# Patient Record
Sex: Female | Born: 1980 | Race: Black or African American | Hispanic: No | Marital: Single | Smoking: Former smoker
Health system: Southern US, Community
[De-identification: ages and names within clinical notes are randomized; demographics above are authoritative.]

## PROBLEM LIST (undated history)

## (undated) DIAGNOSIS — D649 Anemia, unspecified: Secondary | ICD-10-CM

## (undated) DIAGNOSIS — D509 Iron deficiency anemia, unspecified: Secondary | ICD-10-CM

## (undated) DIAGNOSIS — D259 Leiomyoma of uterus, unspecified: Secondary | ICD-10-CM

## (undated) DIAGNOSIS — N83201 Unspecified ovarian cyst, right side: Secondary | ICD-10-CM

## (undated) HISTORY — PX: TONSILLECTOMY: SUR1361

## (undated) HISTORY — DX: Anemia, unspecified: D64.9

## (undated) HISTORY — PX: OTHER SURGICAL HISTORY: SHX169

---
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
## 2004-09-04 ENCOUNTER — Ambulatory Visit: Payer: Self-pay | Admitting: Internal Medicine

## 2006-05-03 ENCOUNTER — Ambulatory Visit: Payer: Self-pay

## 2007-06-07 IMAGING — CT CT HEAD WITHOUT AND WITH CONTRAST
1 of 2 series · 13 of 30 positions shown, 17 images · non-contrast
Comparison: none

REASON FOR EXAM: syncope
COMMENTS:

[Series 2: without · axial · non-contrast · 0.39mm/px · z∈[-133,-13]mm · 13 of 29 slices shown, 17 images]
[im 3/29  brain]
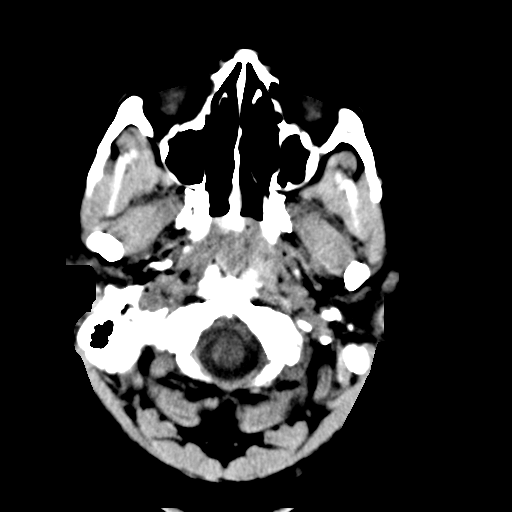
[im 3/29  bone]
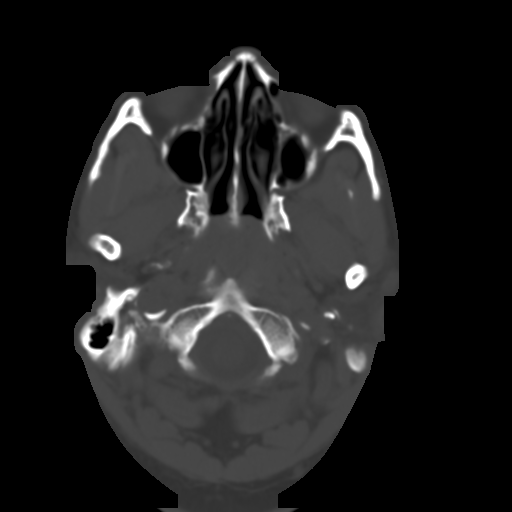
[im 5/29  brain]
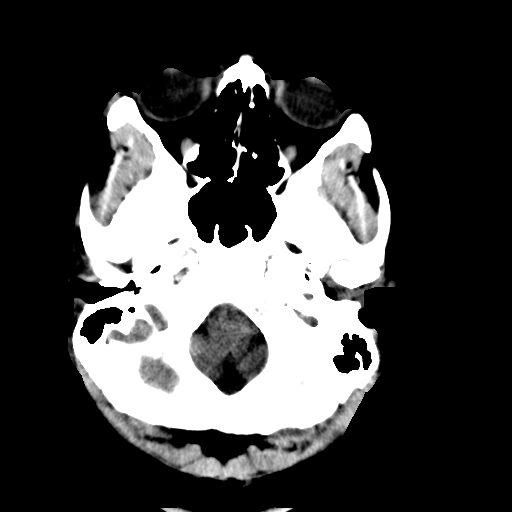
[im 7/29  brain]
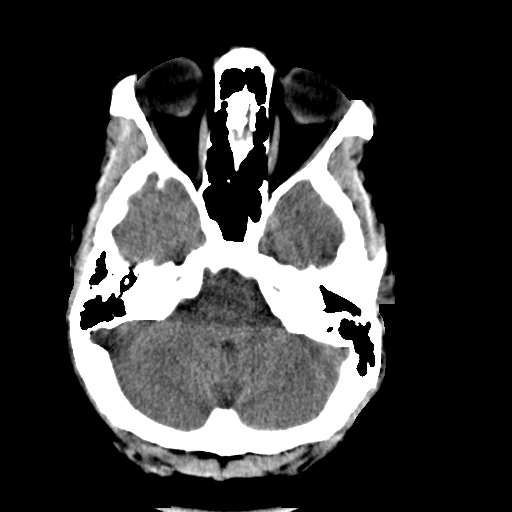
[im 9/29  brain]
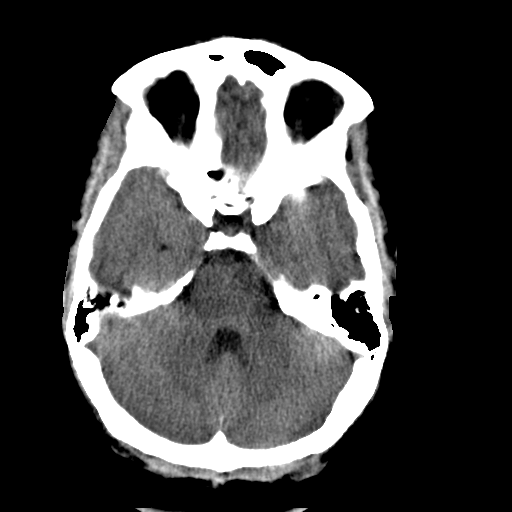
[im 11/29  brain]
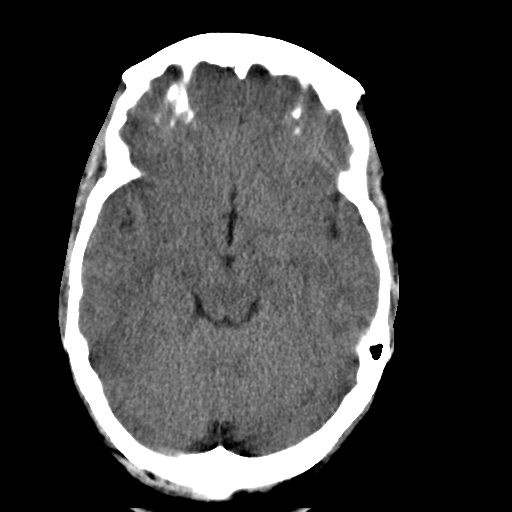
[im 11/29  bone]
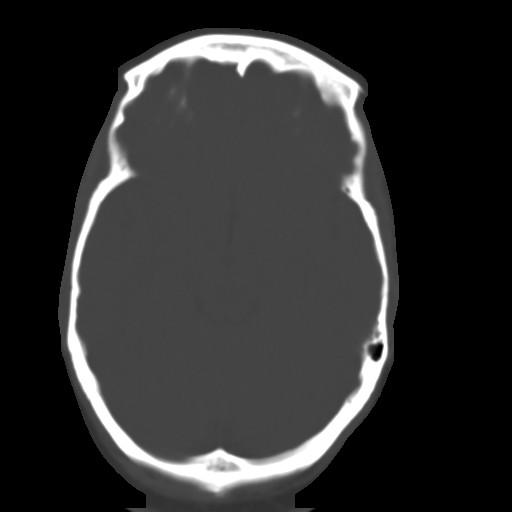
[im 13/29  brain]
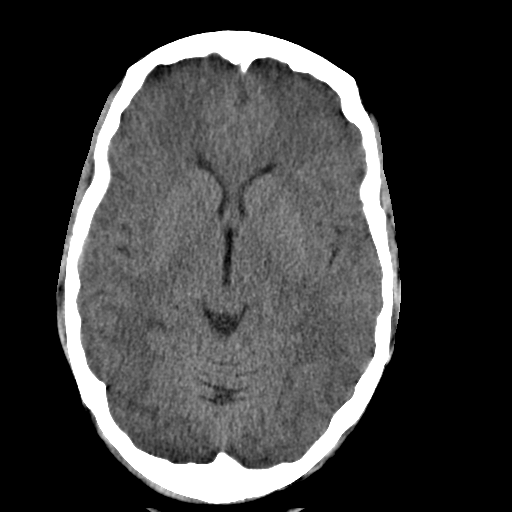
[im 15/29  brain]
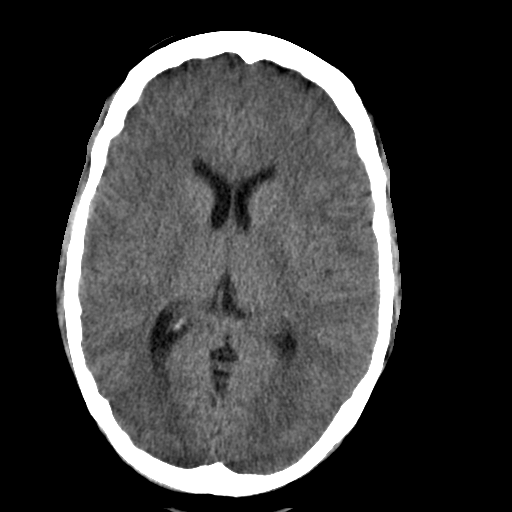
[im 17/29  brain]
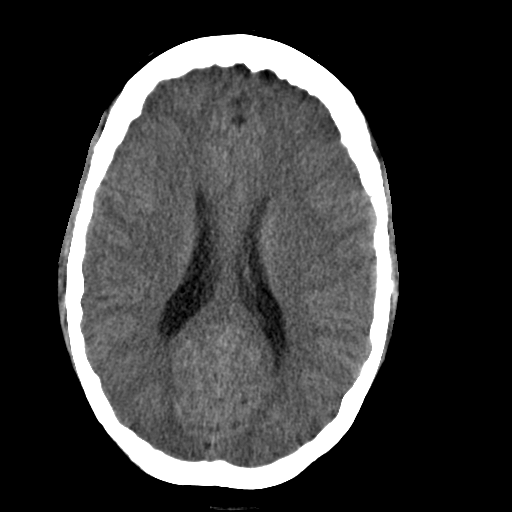
[im 19/29  brain]
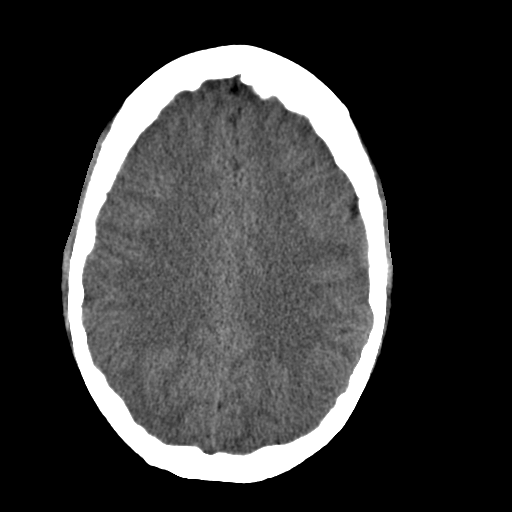
[im 19/29  bone]
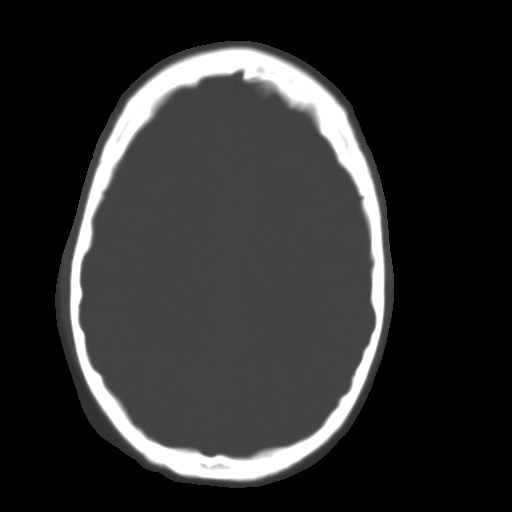
[im 21/29  brain]
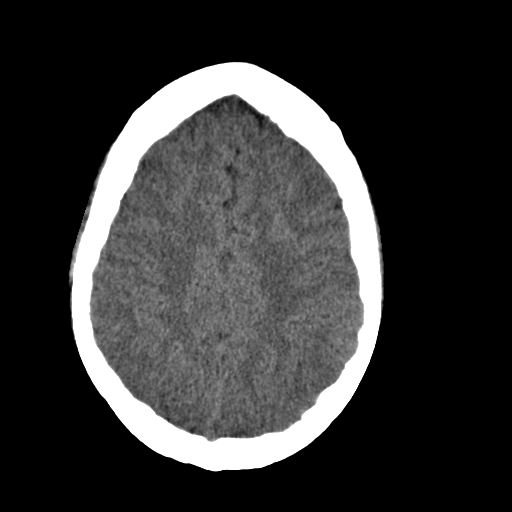
[im 23/29  brain]
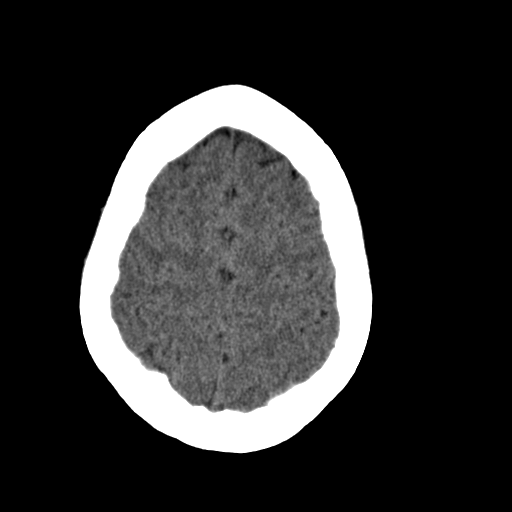
[im 25/29  brain]
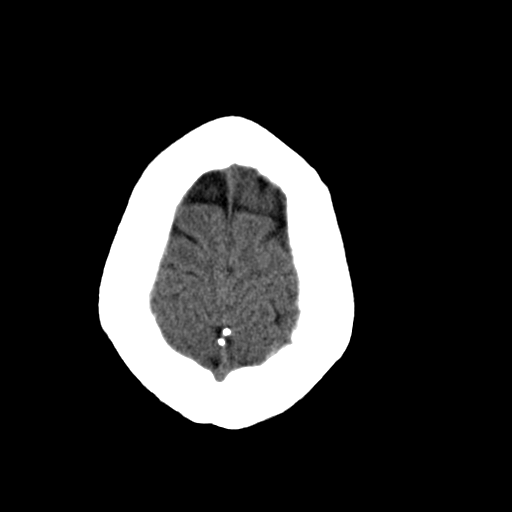
[im 27/29  brain]
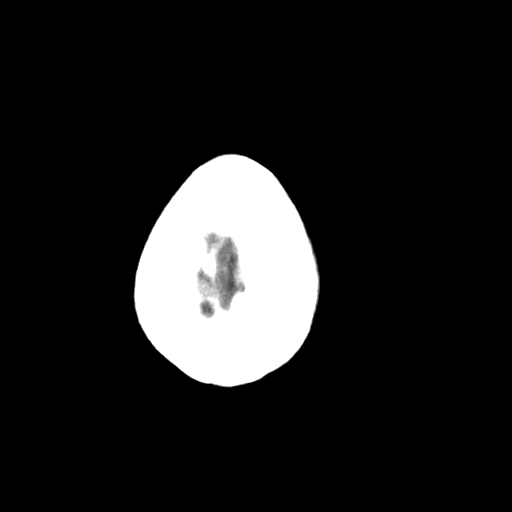
[im 27/29  bone]
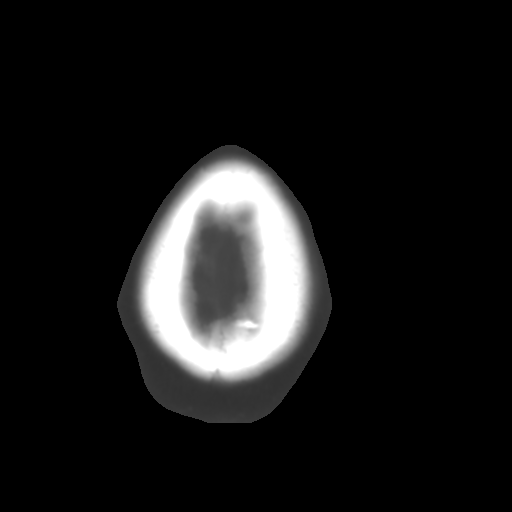

[13 of 30 positions shown; findings below may reference images not displayed]

PROCEDURE:     CT  - CT HEAD W/WO  - May 03, 2006  [DATE]

RESULT:     Pre and post contrast CT of the brain is performed. The
paranasal sinuses appear to be normally aerated. There is no air fluid
level. The mastoid sinuses appear to be unremarkable.  There is no evidence
of hemorrhage. There is no mass effect or midline shift. There is no
extra-axial hemorrhage. There is no abnormal enhancement.
IMPRESSION: 1)No focal intracranial abnormality evident.  Should the patient's symptoms
persist consideration for MRI would be recommended.

## 2009-12-01 ENCOUNTER — Ambulatory Visit: Payer: Self-pay | Admitting: General Practice

## 2012-03-07 ENCOUNTER — Emergency Department: Payer: Self-pay | Admitting: Unknown Physician Specialty

## 2014-04-22 ENCOUNTER — Emergency Department: Payer: Self-pay | Admitting: Emergency Medicine

## 2022-02-08 ENCOUNTER — Other Ambulatory Visit: Payer: Self-pay | Admitting: Internal Medicine

## 2022-02-08 DIAGNOSIS — N6321 Unspecified lump in the left breast, upper outer quadrant: Secondary | ICD-10-CM

## 2022-02-25 DIAGNOSIS — D509 Iron deficiency anemia, unspecified: Secondary | ICD-10-CM | POA: Insufficient documentation

## 2022-02-25 NOTE — Progress Notes (Signed)
?[place]  ?[phone] [phone] ? ?ID: Krista Holloway OB: 1981-04-23  MR#: [id number]  JJK#:[id number] ? ?Patient Care Team: ?Perrin Maltese, MD as PCP - General (Internal Medicine) ? ?CHIEF COMPLAINT: Iron deficiency anemia.  ? ?INTERVAL HISTORY: Patient is a 41 year old female who has had chronic iron deficiency anemia for years secondary to heavy menses.  She admits to difficulty tolerating oral iron supplementation.  More recently, she has noted to have hemoglobin of 4.9.  She has chronic weakness and fatigue, but otherwise feels well.  She has no neurologic complaints.  She denies any recent fevers or illnesses.  She has a good appetite and denies weight loss.  She has no chest pain, shortness of breath, cough, or hemoptysis.  She denies any nausea, vomiting, constipation, or diarrhea.  She has no melena or hematochezia.  She has no urinary complaints.  Patient otherwise feels well and offers no further specific complaints today. ? ?REVIEW OF SYSTEMS:   ?Review of Systems  ?Constitutional:  Positive for malaise/fatigue. Negative for fever and weight loss.  ?Respiratory: Negative.  Negative for cough, hemoptysis and shortness of breath.   ?Cardiovascular: Negative.  Negative for chest pain and leg swelling.  ?Gastrointestinal: Negative.  Negative for abdominal pain, blood in stool and melena.  ?Genitourinary: Negative.  Negative for hematuria.  ?Musculoskeletal: Negative.  Negative for back pain.  ?Skin: Negative.  Negative for rash.  ?Neurological:  Positive for weakness. Negative for dizziness and focal weakness.  ?Psychiatric/Behavioral: Negative.  The patient is not nervous/anxious.   ? ?As per HPI. Otherwise, a complete review of systems is negative. ? ?PAST MEDICAL HISTORY: ?No past medical history on file. ? ?PAST SURGICAL HISTORY: ?No past surgical history on file. ? ?FAMILY HISTORY: ?Family History  ?Problem Relation Age of Onset  ? Breast cancer Neg Hx    ? ? ?ADVANCED DIRECTIVES (Y/N):  N ? ?HEALTH MAINTENANCE: ?  ? ? Colonoscopy: ? PAP: ? Bone density: ? Lipid panel: ? ?Allergies  ?Allergen Reactions  ? Amoxicillin   ?  Other reaction(s): Unknown ?Other reaction(s): Unknown ?  ? ? ?Current Outpatient Medications  ?Medication Sig Dispense Refill  ? HYDROcodone-acetaminophen (NORCO/VICODIN) 5-325 MG tablet Take by mouth.    ? Multiple Vitamin (MULTI-VITAMIN) tablet Take 1 tablet by mouth daily.    ? ?No current facility-administered medications for this visit.  ? ? ?OBJECTIVE: ?There were no vitals filed for this visit.   There is no height or weight on file to calculate BMI.    ECOG FS:1 - Symptomatic but completely ambulatory ? ?General: Well-developed, well-nourished, no acute distress. ?Eyes: Pink conjunctiva, anicteric sclera. ?HEENT: Normocephalic, moist mucous membranes. ?Lungs: No audible wheezing or coughing. ?Heart: Regular rate and rhythm. ?Abdomen: Soft, nontender, no obvious distention. ?Musculoskeletal: No edema, cyanosis, or clubbing. ?Neuro: Alert, answering all questions appropriately. Cranial nerves grossly intact. ?Skin: No rashes or petechiae noted. ?Psych: Normal affect. ?Lymphatics: No cervical, calvicular, axillary or inguinal LAD. ? ? ?LAB RESULTS: ? ?No results found for: NA, K, CL, CO2, GLUCOSE, BUN, CREATININE, CALCIUM, PROT, ALBUMIN, AST, ALT, ALKPHOS, BILITOT, GFRNONAA, GFRAA ? ?Lab Results  ?Component Value Date  ? WBC 3.3 (L) 02/26/2022  ? NEUTROABS 1.4 (L) 02/26/2022  ? HGB 4.9 (LL) 02/26/2022  ? HCT 20.6 (L) 02/26/2022  ? MCV 56.6 (L) 02/26/2022  ? PLT 493 (H) 02/26/2022  ? ?No results found for: IRON, TIBC, IRONPCTSAT ?No results found for: FERRITIN ? ? ?STUDIES: ?US BREAST LTD UNI LEFT INC  AXILLA ? ?Result Date: 02/26/2022 ?CLINICAL DATA:  41 year old female presenting for evaluation of a palpable lump in the left breast for 2-3 weeks. Of note, the patient was recently treated for a significant urinary tract infection, and feels  that since she took the antibiotics, that the lump and pain reduced in size and tenderness as well. EXAM: DIGITAL DIAGNOSTIC BILATERAL MAMMOGRAM WITH TOMOSYNTHESIS AND CAD; ULTRASOUND LEFT BREAST LIMITED TECHNIQUE: Bilateral digital diagnostic mammography and breast tomosynthesis was performed. The images were evaluated with computer-aided detection.; Targeted ultrasound examination of the left breast was performed. COMPARISON:  Previous exam(s). ACR Breast Density Category b: There are scattered areas of fibroglandular density. FINDINGS: There is a 6 cm focal asymmetry in the upper outer quadrant of the left breast, anterior depth which is deep to the BB which has been placed at the palpable site of concern. There may be an obscured mass within this asymmetry. No other suspicious calcifications, masses or areas of distortion are seen in the bilateral breasts. Ultrasound targeted to the palpable site in the left breast at 2:30, 3 cm from the nipple demonstrates a in anechoic oval circumscribed mass measuring 1.3 cm. There is a surrounding area of hypoechoic tissue, which altogether spans approximately 2.3 cm. IMPRESSION: 1. There is a 6 cm focal asymmetry on the mammogram surrounding a benign-appearing cyst. This may represent a cyst with resolving infection given its response to her course of antibiotics. 2. No suspicious calcifications, masses or areas of distortion are seen in the bilateral breasts. RECOMMENDATION: 1. Three month follow-up left breast mammogram and ultrasound is recommended. The patient was advised to come back sooner if she feels the lump is increasing in size or becoming more painful. She is also aware that if the abnormality is concerning in appearance on her follow-up than biopsy would be recommended. I have discussed the findings and recommendations with the patient. If applicable, a reminder letter will be sent to the patient regarding the next appointment. BI-RADS CATEGORY  3: Probably  benign. Electronically Signed   By: Ammie Ferrier M.D.   On: 02/26/2022 11:39 ? ?MM DIAG BREAST TOMO BILATERAL ? ?Result Date: 02/26/2022 ?CLINICAL DATA:  41 year old female presenting for evaluation of a palpable lump in the left breast for 2-3 weeks. Of note, the patient was recently treated for a significant urinary tract infection, and feels that since she took the antibiotics, that the lump and pain reduced in size and tenderness as well. EXAM: DIGITAL DIAGNOSTIC BILATERAL MAMMOGRAM WITH TOMOSYNTHESIS AND CAD; ULTRASOUND LEFT BREAST LIMITED TECHNIQUE: Bilateral digital diagnostic mammography and breast tomosynthesis was performed. The images were evaluated with computer-aided detection.; Targeted ultrasound examination of the left breast was performed. COMPARISON:  Previous exam(s). ACR Breast Density Category b: There are scattered areas of fibroglandular density. FINDINGS: There is a 6 cm focal asymmetry in the upper outer quadrant of the left breast, anterior depth which is deep to the BB which has been placed at the palpable site of concern. There may be an obscured mass within this asymmetry. No other suspicious calcifications, masses or areas of distortion are seen in the bilateral breasts. Ultrasound targeted to the palpable site in the left breast at 2:30, 3 cm from the nipple demonstrates a in anechoic oval circumscribed mass measuring 1.3 cm. There is a surrounding area of hypoechoic tissue, which altogether spans approximately 2.3 cm. IMPRESSION: 1. There is a 6 cm focal asymmetry on the mammogram surrounding a benign-appearing cyst. This may represent a cyst with resolving infection  given its response to her course of antibiotics. 2. No suspicious calcifications, masses or areas of distortion are seen in the bilateral breasts. RECOMMENDATION: 1. Three month follow-up left breast mammogram and ultrasound is recommended. The patient was advised to come back sooner if she feels the lump is  increasing in size or becoming more painful. She is also aware that if the abnormality is concerning in appearance on her follow-up than biopsy would be recommended. I have discussed the findings and recommendations wit

## 2022-02-26 ENCOUNTER — Ambulatory Visit
Admission: RE | Admit: 2022-02-26 | Discharge: 2022-02-26 | Disposition: A | Discharge status: Home / Self Care | Payer: BC Managed Care – PPO | Source: Ambulatory Visit | Attending: Internal Medicine | Admitting: Internal Medicine

## 2022-02-26 ENCOUNTER — Telehealth: Payer: Self-pay

## 2022-02-26 ENCOUNTER — Inpatient Hospital Stay: Payer: BC Managed Care – PPO | Attending: Oncology

## 2022-02-26 ENCOUNTER — Other Ambulatory Visit: Payer: Self-pay

## 2022-02-26 ENCOUNTER — Other Ambulatory Visit: Payer: Self-pay | Admitting: Oncology

## 2022-02-26 ENCOUNTER — Encounter: Payer: Self-pay | Admitting: *Deleted

## 2022-02-26 ENCOUNTER — Other Ambulatory Visit: Payer: Self-pay | Admitting: Nurse Practitioner

## 2022-02-26 DIAGNOSIS — D509 Iron deficiency anemia, unspecified: Secondary | ICD-10-CM

## 2022-02-26 DIAGNOSIS — D72819 Decreased white blood cell count, unspecified: Secondary | ICD-10-CM | POA: Insufficient documentation

## 2022-02-26 DIAGNOSIS — D75839 Thrombocytosis, unspecified: Secondary | ICD-10-CM | POA: Insufficient documentation

## 2022-02-26 DIAGNOSIS — N6321 Unspecified lump in the left breast, upper outer quadrant: Secondary | ICD-10-CM

## 2022-02-26 DIAGNOSIS — N920 Excessive and frequent menstruation with regular cycle: Secondary | ICD-10-CM | POA: Insufficient documentation

## 2022-02-26 LAB — CBC WITH DIFFERENTIAL/PLATELET
Abs Immature Granulocytes: 0.01 10*3/uL (ref 0.00–0.07)
Basophils Absolute: 0 10*3/uL (ref 0.0–0.1)
Basophils Relative: 1 %
Eosinophils Absolute: 0 10*3/uL (ref 0.0–0.5)
Eosinophils Relative: 1 %
HCT: 20.6 % — ABNORMAL LOW (ref 36.0–46.0)
Hemoglobin: 4.9 g/dL — CL (ref 12.0–15.0)
Immature Granulocytes: 0 %
Lymphocytes Relative: 43 %
Lymphs Abs: 1.4 10*3/uL (ref 0.7–4.0)
MCH: 13.5 pg — ABNORMAL LOW (ref 26.0–34.0)
MCHC: 23.8 g/dL — ABNORMAL LOW (ref 30.0–36.0)
MCV: 56.6 fL — ABNORMAL LOW (ref 80.0–100.0)
Monocytes Absolute: 0.4 10*3/uL (ref 0.1–1.0)
Monocytes Relative: 12 %
Neutro Abs: 1.4 10*3/uL — ABNORMAL LOW (ref 1.7–7.7)
Neutrophils Relative %: 43 %
Platelets: 493 10*3/uL — ABNORMAL HIGH (ref 150–400)
RBC: 3.64 MIL/uL — ABNORMAL LOW (ref 3.87–5.11)
RDW: 21.7 % — ABNORMAL HIGH (ref 11.5–15.5)
WBC: 3.3 10*3/uL — ABNORMAL LOW (ref 4.0–10.5)
nRBC: 0 % (ref 0.0–0.2)

## 2022-02-26 LAB — SAMPLE TO BLOOD BANK

## 2022-02-26 LAB — ABO/RH: ABO/RH(D): A POS

## 2022-02-26 NOTE — Telephone Encounter (Signed)
Spoke with patients sister and advised that her hemoglobin is 4.9. She is going to need 2 units of blood in the morning and her appointment has been moved up to 8:30. Advised that Dr. Grayland Ormond will see her in infusion for her appointment. She is aware and expressed understanding. ?

## 2022-02-27 ENCOUNTER — Inpatient Hospital Stay: Payer: BC Managed Care – PPO | Admitting: Oncology

## 2022-02-27 ENCOUNTER — Inpatient Hospital Stay: Payer: BC Managed Care – PPO

## 2022-02-27 DIAGNOSIS — D75839 Thrombocytosis, unspecified: Secondary | ICD-10-CM | POA: Diagnosis not present

## 2022-02-27 DIAGNOSIS — D509 Iron deficiency anemia, unspecified: Secondary | ICD-10-CM

## 2022-02-27 DIAGNOSIS — D72819 Decreased white blood cell count, unspecified: Secondary | ICD-10-CM

## 2022-02-27 DIAGNOSIS — N920 Excessive and frequent menstruation with regular cycle: Secondary | ICD-10-CM

## 2022-02-27 LAB — PREPARE RBC (CROSSMATCH)

## 2022-02-27 MED ORDER — DIPHENHYDRAMINE HCL 50 MG/ML IJ SOLN
25.0000 mg | Freq: Once | INTRAMUSCULAR | Status: AC
  Administered 2022-02-27: 25 mg via INTRAVENOUS
  Filled 2022-02-27: qty 1

## 2022-02-27 MED ORDER — SODIUM CHLORIDE 0.9% IV SOLUTION
250.0000 mL | Freq: Once | INTRAVENOUS | Status: AC
  Administered 2022-02-27: 250 mL via INTRAVENOUS
  Filled 2022-02-27: qty 250

## 2022-02-27 MED ORDER — ACETAMINOPHEN 325 MG PO TABS
650.0000 mg | ORAL_TABLET | Freq: Once | ORAL | Status: AC
  Administered 2022-02-27: 650 mg via ORAL
  Filled 2022-02-27: qty 2

## 2022-02-27 NOTE — Patient Instructions (Signed)
Blood Transfusion, Adult A blood transfusion is a procedure in which you receive blood or a type of blood cell (blood component) through an IV. You may need a blood transfusion when your blood level is low. This may result from a bleeding disorder, illness, injury, or surgery. The blood may come from a donor. You may also be able to donate blood for yourself (autologous blood donation) before a planned surgery. The blood given in a transfusion is made up of different blood components. You may receive: Red blood cells. These carry oxygen to the cells in the body. Platelets. These help your blood to clot. Plasma. This is the liquid part of your blood. It carries proteins and other substances throughout the body. White blood cells. These help you fight infections. If you have hemophilia or another clotting disorder, you may also receive other types of blood products. Tell a health care provider about: Any blood disorders you have. Any previous reactions you have had during a blood transfusion. Any allergies you have. All medicines you are taking, including vitamins, herbs, eye drops, creams, and over-the-counter medicines. Any surgeries you have had. Any medical conditions you have, including any recent fever or cold symptoms. Whether you are pregnant or may be pregnant. What are the risks? Generally, this is a safe procedure. However, problems may occur. The most common problems include: A mild allergic reaction, such as red, swollen areas of skin (hives) and itching. Fever or chills. This may be the body's response to new blood cells received. This may occur during or up to 4 hours after the transfusion. More serious problems may include: Transfusion-associated circulatory overload (TACO), or too much fluid in the lungs. This may cause breathing problems. A serious allergic reaction, such as difficulty breathing or swelling around the face and lips. Transfusion-related acute lung injury  (TRALI), which causes breathing difficulty and low oxygen in the blood. This can occur within hours of the transfusion or several days later. Iron overload. This can happen after receiving many blood transfusions over a period of time. Infection or virus being transmitted. This is rare because donated blood is carefully tested before it is given. Hemolytic transfusion reaction. This is rare. It happens when your body's defense system (immune system)tries to attack the new blood cells. Symptoms may include fever, chills, nausea, low blood pressure, and low back or chest pain. Transfusion-associated graft-versus-host disease (TAGVHD). This is rare. It happens when donated cells attack your body's healthy tissues. What happens before the procedure? Medicines Ask your health care provider about: Changing or stopping your regular medicines. This is especially important if you are taking diabetes medicines or blood thinners. Taking medicines such as aspirin and ibuprofen. These medicines can thin your blood. Do not take these medicines unless your health care provider tells you to take them. Taking over-the-counter medicines, vitamins, herbs, and supplements. General instructions Follow instructions from your health care provider about eating and drinking restrictions. You will have a blood test to determine your blood type. This is necessary to know what kind of blood your body will accept and to match it to the donor blood. If you are going to have a planned surgery, you may be able to do an autologous blood donation. This may be done in case you need to have a transfusion. You will have your temperature, blood pressure, and pulse monitored before the transfusion. If you have had an allergic reaction to a transfusion in the past, you may be given medicine to help prevent   a reaction. This medicine may be given to you by mouth (orally) or through an IV. Set aside time for the blood transfusion. This  procedure generally takes 1-4 hours to complete. What happens during the procedure?  An IV will be inserted into one of your veins. The bag of donated blood will be attached to your IV. The blood will then enter through your vein. Your temperature, blood pressure, and pulse will be monitored regularly during the transfusion. This monitoring is done to detect early signs of a transfusion reaction. Tell your nurse right away if you have any of these symptoms during the transfusion: Shortness of breath or trouble breathing. Chest or back pain. Fever or chills. Hives or itching. If you have any signs or symptoms of a reaction, your transfusion will be stopped and you may be given medicine. When the transfusion is complete, your IV will be removed. Pressure may be applied to the IV site for a few minutes. A bandage (dressing)will be applied. The procedure may vary among health care providers and hospitals. What happens after the procedure? Your temperature, blood pressure, pulse, breathing rate, and blood oxygen level will be monitored until you leave the hospital or clinic. Your blood may be tested to see how you are responding to the transfusion. You may be warmed with fluids or blankets to maintain a normal body temperature. If you receive your blood transfusion in an outpatient setting, you will be told whom to contact to report any reactions. Where to find more information For more information on blood transfusions, visit the [employer]: redcross.org Summary A blood transfusion is a procedure in which you receive blood or a type of blood cell (blood component) through an IV. The blood you receive may come from a donor or be donated by yourself (autologous blood donation) before a planned surgery. The blood given in a transfusion is made up of different blood components. You may receive red blood cells, platelets, plasma, or white blood cells depending on the condition treated. Your  temperature, blood pressure, and pulse will be monitored before, during, and after the transfusion. After the transfusion, your blood may be tested to see how your body has responded. This information is not intended to replace advice given to you by your health care provider. Make sure you discuss any questions you have with your health care provider. Document Revised: 08/06/2019 Document Reviewed: 03/26/2019 [employer] Patient Education  2023 [employer]  

## 2022-02-28 ENCOUNTER — Encounter: Payer: Self-pay | Admitting: Oncology

## 2022-02-28 LAB — TYPE AND SCREEN
ABO/RH(D): A POS
Antibody Screen: POSITIVE
Unit division: 0
Unit division: 0

## 2022-02-28 LAB — BPAM RBC
Blood Product Expiration Date: 202306142359
Blood Product Expiration Date: 202306142359
ISSUE DATE / TIME: 202305160854
ISSUE DATE / TIME: 202305161056
Unit Type and Rh: 6200
Unit Type and Rh: 6200

## 2022-03-01 ENCOUNTER — Encounter: Payer: Self-pay | Admitting: Internal Medicine

## 2022-03-07 ENCOUNTER — Encounter: Payer: Self-pay | Admitting: Obstetrics and Gynecology

## 2022-03-09 NOTE — Progress Notes (Unsigned)
Krista Holloway  [phone] [phone]  ID: Elvera Lennox OB: [birth year]  MR#: [id number]  JME#:[id number]  Patient Care Team: Perrin Maltese, MD as PCP - General (Internal Medicine)  CHIEF COMPLAINT: Iron deficiency anemia.   INTERVAL HISTORY: Patient returns to clinic today for repeat laboratory work, further evaluation, and consideration of IV Venofer.  She feels significantly improved after receiving 2 units of packed red blood cells recently.  She has no neurologic complaints.  She denies any recent fevers or illnesses.  She has a good appetite and denies weight loss.  She has no chest pain, shortness of breath, cough, or hemoptysis.  She denies any nausea, vomiting, constipation, or diarrhea.  She has no melena or hematochezia.  She has no urinary complaints.  Patient offers no further specific complaints today.  REVIEW OF SYSTEMS:   Review of Systems  Constitutional:  Positive for malaise/fatigue. Negative for fever and weight loss.  Respiratory: Negative.  Negative for cough, hemoptysis and shortness of breath.   Cardiovascular: Negative.  Negative for chest pain and leg swelling.  Gastrointestinal: Negative.  Negative for abdominal pain, blood in stool and melena.  Genitourinary: Negative.  Negative for hematuria.  Musculoskeletal: Negative.  Negative for back pain.  Skin: Negative.  Negative for rash.  Neurological:  Positive for weakness. Negative for dizziness and focal weakness.  Psychiatric/Behavioral: Negative.  The patient is not nervous/anxious.    As per HPI. Otherwise, a complete review of systems is negative.  PAST MEDICAL HISTORY: History reviewed. No pertinent past medical history.  PAST SURGICAL HISTORY: History reviewed. No pertinent surgical history.  FAMILY HISTORY: Family History  Problem Relation Age of Onset   Breast cancer Neg Hx     ADVANCED DIRECTIVES (Y/N):  N  HEALTH MAINTENANCE:      Colonoscopy:  PAP:  Bone density:  Lipid panel:  Allergies  Allergen Reactions   Amoxicillin     Other reaction(s): Unknown Other reaction(s): Unknown     Current Outpatient Medications  Medication Sig Dispense Refill   HYDROcodone-acetaminophen (NORCO/VICODIN) 5-325 MG tablet Take by mouth. (Patient not taking: Reported on 03/15/2022)     Multiple Vitamin (MULTI-VITAMIN) tablet Take 1 tablet by mouth daily. (Patient not taking: Reported on 03/15/2022)     No current facility-administered medications for this visit.    OBJECTIVE: Vitals:   03/15/22 1432  BP: 116/71  Pulse: 65  Resp: 16  Temp: 98.9 F (37.2 C)  SpO2: 100%     There is no height or weight on file to calculate BMI.    ECOG FS:0 - Asymptomatic  General: Well-developed, well-nourished, no acute distress. Eyes: Pink conjunctiva, anicteric sclera. HEENT: Normocephalic, moist mucous membranes. Lungs: No audible wheezing or coughing. Heart: Regular rate and rhythm. Abdomen: Soft, nontender, no obvious distention. Musculoskeletal: No edema, cyanosis, or clubbing. Neuro: Alert, answering all questions appropriately. Cranial nerves grossly intact. Skin: No rashes or petechiae noted. Psych: Normal affect.   LAB RESULTS:  No results found for: NA, K, CL, CO2, GLUCOSE, BUN, CREATININE, CALCIUM, PROT, ALBUMIN, AST, ALT, ALKPHOS, BILITOT, GFRNONAA, GFRAA  Lab Results  Component Value Date   WBC 4.6 03/14/2022   NEUTROABS 2.5 03/14/2022   HGB 7.1 (L) 03/14/2022   HCT 26.4 (L) 03/14/2022   MCV 63.6 (L) 03/14/2022   PLT 542 (H) 03/14/2022   Lab Results  Component Value Date   IRON 38 03/14/2022   TIBC 477 (H) 03/14/2022   IRONPCTSAT 8 (L) 03/14/2022   Lab  Results  Component Value Date   FERRITIN 3 (L) 03/14/2022     STUDIES: US BREAST LTD UNI LEFT INC AXILLA  Result Date: 02/26/2022 CLINICAL DATA:  41 year old female presenting for evaluation of a palpable lump in the left breast for 2-3 weeks. Of  note, the patient was recently treated for a significant urinary tract infection, and feels that since she took the antibiotics, that the lump and pain reduced in size and tenderness as well. EXAM: DIGITAL DIAGNOSTIC BILATERAL MAMMOGRAM WITH TOMOSYNTHESIS AND CAD; ULTRASOUND LEFT BREAST LIMITED TECHNIQUE: Bilateral digital diagnostic mammography and breast tomosynthesis was performed. The images were evaluated with computer-aided detection.; Targeted ultrasound examination of the left breast was performed. COMPARISON:  Previous exam(s). ACR Breast Density Category b: There are scattered areas of fibroglandular density. FINDINGS: There is a 6 cm focal asymmetry in the upper outer quadrant of the left breast, anterior depth which is deep to the BB which has been placed at the palpable site of concern. There may be an obscured mass within this asymmetry. No other suspicious calcifications, masses or areas of distortion are seen in the bilateral breasts. Ultrasound targeted to the palpable site in the left breast at 2:30, 3 cm from the nipple demonstrates a in anechoic oval circumscribed mass measuring 1.3 cm. There is a surrounding area of hypoechoic tissue, which altogether spans approximately 2.3 cm. IMPRESSION: 1. There is a 6 cm focal asymmetry on the mammogram surrounding a benign-appearing cyst. This may represent a cyst with resolving infection given its response to her course of antibiotics. 2. No suspicious calcifications, masses or areas of distortion are seen in the bilateral breasts. RECOMMENDATION: 1. Three month follow-up left breast mammogram and ultrasound is recommended. The patient was advised to come back sooner if she feels the lump is increasing in size or becoming more painful. She is also aware that if the abnormality is concerning in appearance on her follow-up than biopsy would be recommended. I have discussed the findings and recommendations with the patient. If applicable, a reminder letter  will be sent to the patient regarding the next appointment. BI-RADS CATEGORY  3: Probably benign. Electronically Signed   By: Ammie Ferrier M.D.   On: 02/26/2022 11:39  MM DIAG BREAST TOMO BILATERAL  Result Date: 02/26/2022 CLINICAL DATA:  41 year old female presenting for evaluation of a palpable lump in the left breast for 2-3 weeks. Of note, the patient was recently treated for a significant urinary tract infection, and feels that since she took the antibiotics, that the lump and pain reduced in size and tenderness as well. EXAM: DIGITAL DIAGNOSTIC BILATERAL MAMMOGRAM WITH TOMOSYNTHESIS AND CAD; ULTRASOUND LEFT BREAST LIMITED TECHNIQUE: Bilateral digital diagnostic mammography and breast tomosynthesis was performed. The images were evaluated with computer-aided detection.; Targeted ultrasound examination of the left breast was performed. COMPARISON:  Previous exam(s). ACR Breast Density Category b: There are scattered areas of fibroglandular density. FINDINGS: There is a 6 cm focal asymmetry in the upper outer quadrant of the left breast, anterior depth which is deep to the BB which has been placed at the palpable site of concern. There may be an obscured mass within this asymmetry. No other suspicious calcifications, masses or areas of distortion are seen in the bilateral breasts. Ultrasound targeted to the palpable site in the left breast at 2:30, 3 cm from the nipple demonstrates a in anechoic oval circumscribed mass measuring 1.3 cm. There is a surrounding area of hypoechoic tissue, which altogether spans approximately 2.3 cm. IMPRESSION: 1.  There is a 6 cm focal asymmetry on the mammogram surrounding a benign-appearing cyst. This may represent a cyst with resolving infection given its response to her course of antibiotics. 2. No suspicious calcifications, masses or areas of distortion are seen in the bilateral breasts. RECOMMENDATION: 1. Three month follow-up left breast mammogram and ultrasound is  recommended. The patient was advised to come back sooner if she feels the lump is increasing in size or becoming more painful. She is also aware that if the abnormality is concerning in appearance on her follow-up than biopsy would be recommended. I have discussed the findings and recommendations with the patient. If applicable, a reminder letter will be sent to the patient regarding the next appointment. BI-RADS CATEGORY  3: Probably benign. Electronically Signed   By: Ammie Ferrier M.D.   On: 02/26/2022 11:39   ASSESSMENT:   Iron deficiency anemia.   PLAN:    Iron deficiency anemia: Likely secondary to heavy menses.  Patient's hemoglobin has improved to 7.1 with 2 units of packed red blood cells recently.  Her iron stores remain significantly reduced.  Proceed with IV Venofer today.  Patient will return to clinic for additional times over the next several weeks for continuation of treatment.  Patient will then return to clinic in 3 months with repeat laboratory work, further evaluation, and additional IV iron if needed.   Leukopenia: Resolved. Thrombocytosis: Chronic and unchanged.  Likely secondary to iron deficiency. Heavy menses: Continue follow-up with gynecology.    Patient expressed understanding and was in agreement with this plan. She also understands that She can call clinic at any time with any questions, concerns, or complaints.    Lloyd Huger, MD   03/16/2022 5:44 AM

## 2022-03-14 ENCOUNTER — Inpatient Hospital Stay: Payer: BC Managed Care – PPO

## 2022-03-14 ENCOUNTER — Other Ambulatory Visit: Payer: Self-pay

## 2022-03-14 DIAGNOSIS — D509 Iron deficiency anemia, unspecified: Secondary | ICD-10-CM | POA: Diagnosis not present

## 2022-03-14 LAB — CBC WITH DIFFERENTIAL/PLATELET
Abs Immature Granulocytes: 0.02 10*3/uL (ref 0.00–0.07)
Basophils Absolute: 0.1 10*3/uL (ref 0.0–0.1)
Basophils Relative: 2 %
Eosinophils Absolute: 0 10*3/uL (ref 0.0–0.5)
Eosinophils Relative: 0 %
HCT: 26.4 % — ABNORMAL LOW (ref 36.0–46.0)
Hemoglobin: 7.1 g/dL — ABNORMAL LOW (ref 12.0–15.0)
Immature Granulocytes: 0 %
Lymphocytes Relative: 33 %
Lymphs Abs: 1.5 10*3/uL (ref 0.7–4.0)
MCH: 17.1 pg — ABNORMAL LOW (ref 26.0–34.0)
MCHC: 26.9 g/dL — ABNORMAL LOW (ref 30.0–36.0)
MCV: 63.6 fL — ABNORMAL LOW (ref 80.0–100.0)
Monocytes Absolute: 0.4 10*3/uL (ref 0.1–1.0)
Monocytes Relative: 10 %
Neutro Abs: 2.5 10*3/uL (ref 1.7–7.7)
Neutrophils Relative %: 55 %
Platelets: 542 10*3/uL — ABNORMAL HIGH (ref 150–400)
RBC: 4.15 MIL/uL (ref 3.87–5.11)
RDW: 28.2 % — ABNORMAL HIGH (ref 11.5–15.5)
WBC: 4.6 10*3/uL (ref 4.0–10.5)
nRBC: 0 % (ref 0.0–0.2)

## 2022-03-14 LAB — SAMPLE TO BLOOD BANK

## 2022-03-14 LAB — IRON AND TIBC
Iron: 38 ug/dL (ref 28–170)
Saturation Ratios: 8 % — ABNORMAL LOW (ref 10.4–31.8)
TIBC: 477 ug/dL — ABNORMAL HIGH (ref 250–450)
UIBC: 439 ug/dL

## 2022-03-14 LAB — FERRITIN: Ferritin: 3 ng/mL — ABNORMAL LOW (ref 11–307)

## 2022-03-15 ENCOUNTER — Inpatient Hospital Stay: Payer: BC Managed Care – PPO

## 2022-03-15 ENCOUNTER — Encounter: Payer: Self-pay | Admitting: Oncology

## 2022-03-15 ENCOUNTER — Inpatient Hospital Stay: Payer: BC Managed Care – PPO | Attending: Oncology | Admitting: Oncology

## 2022-03-15 VITALS — BP 116/71 | HR 65 | Temp 98.9°F | Resp 16 | Wt 181.6 lb

## 2022-03-15 VITALS — BP 121/62 | HR 64

## 2022-03-15 DIAGNOSIS — D509 Iron deficiency anemia, unspecified: Secondary | ICD-10-CM | POA: Insufficient documentation

## 2022-03-15 DIAGNOSIS — Z87440 Personal history of urinary (tract) infections: Secondary | ICD-10-CM | POA: Insufficient documentation

## 2022-03-15 DIAGNOSIS — D75839 Thrombocytosis, unspecified: Secondary | ICD-10-CM | POA: Insufficient documentation

## 2022-03-15 MED ORDER — IRON SUCROSE 20 MG/ML IV SOLN
200.0000 mg | Freq: Once | INTRAVENOUS | Status: AC
  Administered 2022-03-15: 200 mg via INTRAVENOUS
  Filled 2022-03-15: qty 10

## 2022-03-15 MED ORDER — SODIUM CHLORIDE 0.9 % IV SOLN: 200.0000 mg | Freq: Once | INTRAVENOUS | Status: DC

## 2022-03-15 MED ORDER — SODIUM CHLORIDE 0.9 % IV SOLN
Freq: Once | INTRAVENOUS | Status: AC
  Filled 2022-03-15: qty 250

## 2022-03-15 NOTE — Patient Instructions (Signed)

## 2022-03-16 ENCOUNTER — Encounter: Payer: Self-pay | Admitting: Oncology

## 2022-03-20 ENCOUNTER — Inpatient Hospital Stay: Payer: BC Managed Care – PPO

## 2022-03-20 VITALS — BP 108/66 | HR 70 | Temp 98.2°F | Resp 16

## 2022-03-20 DIAGNOSIS — D509 Iron deficiency anemia, unspecified: Secondary | ICD-10-CM | POA: Diagnosis not present

## 2022-03-20 MED ORDER — IRON SUCROSE 20 MG/ML IV SOLN
200.0000 mg | Freq: Once | INTRAVENOUS | Status: AC
  Administered 2022-03-20: 200 mg via INTRAVENOUS
  Filled 2022-03-20: qty 10

## 2022-03-20 MED ORDER — SODIUM CHLORIDE 0.9 % IV SOLN
Freq: Once | INTRAVENOUS | Status: AC
  Filled 2022-03-20: qty 250

## 2022-03-20 MED ORDER — SODIUM CHLORIDE 0.9 % IV SOLN: 200.0000 mg | Freq: Once | INTRAVENOUS | Status: DC

## 2022-03-20 NOTE — Patient Instructions (Signed)

## 2022-03-22 ENCOUNTER — Inpatient Hospital Stay: Payer: BC Managed Care – PPO

## 2022-03-22 VITALS — BP 112/67 | HR 82 | Temp 98.4°F | Resp 17

## 2022-03-22 DIAGNOSIS — D509 Iron deficiency anemia, unspecified: Secondary | ICD-10-CM

## 2022-03-22 MED ORDER — IRON SUCROSE 20 MG/ML IV SOLN
200.0000 mg | Freq: Once | INTRAVENOUS | Status: AC
  Administered 2022-03-22: 200 mg via INTRAVENOUS
  Filled 2022-03-22: qty 10

## 2022-03-22 MED ORDER — SODIUM CHLORIDE 0.9 % IV SOLN
Freq: Once | INTRAVENOUS | Status: AC
  Filled 2022-03-22: qty 250

## 2022-03-22 MED ORDER — SODIUM CHLORIDE 0.9 % IV SOLN: 200.0000 mg | Freq: Once | INTRAVENOUS | Status: DC

## 2022-03-22 NOTE — Progress Notes (Signed)
Patient tolerated Venofer infusion well, no questions/concerns voiced. Patient stable at discharge. AVS given.   ?

## 2022-03-27 ENCOUNTER — Inpatient Hospital Stay: Payer: BC Managed Care – PPO

## 2022-03-27 VITALS — BP 115/69 | HR 54 | Temp 98.7°F | Resp 17

## 2022-03-27 DIAGNOSIS — D509 Iron deficiency anemia, unspecified: Secondary | ICD-10-CM

## 2022-03-27 MED ORDER — IRON SUCROSE 20 MG/ML IV SOLN
200.0000 mg | Freq: Once | INTRAVENOUS | Status: AC
  Administered 2022-03-27: 200 mg via INTRAVENOUS
  Filled 2022-03-27: qty 10

## 2022-03-27 MED ORDER — SODIUM CHLORIDE 0.9 % IV SOLN: 200.0000 mg | Freq: Once | INTRAVENOUS | Status: DC

## 2022-03-27 MED ORDER — SODIUM CHLORIDE 0.9 % IV SOLN
Freq: Once | INTRAVENOUS | Status: AC
  Filled 2022-03-27: qty 250

## 2022-03-28 ENCOUNTER — Ambulatory Visit: Payer: BC Managed Care – PPO | Admitting: Obstetrics and Gynecology

## 2022-03-28 ENCOUNTER — Encounter: Payer: Self-pay | Admitting: Obstetrics and Gynecology

## 2022-03-28 VITALS — BP 125/75 | HR 74 | Ht 70.0 in | Wt 179.8 lb

## 2022-03-28 DIAGNOSIS — N920 Excessive and frequent menstruation with regular cycle: Secondary | ICD-10-CM

## 2022-03-28 NOTE — Progress Notes (Signed)
HPI:      [name] is a 41 y.o. G0P0000 who LMP was Patient's last menstrual period was 03/27/2022.  Subjective:   She presents today because she has been having very heavy menstrual bleeding for most of her life.  She recently was found to have a hemoglobin of 4.9 from this heavy menstrual bleeding.  She has had a blood transfusion and for iron infusions over the last 6 weeks.  She says she feels somewhat better since this.  She presents today to discuss possible solutions for her heavy menstrual bleeding and blood loss anemia. She has never been pregnant and she does not want children. She has used birth control in the past but is not currently using any form of birth control. (Abstinence) She has normal regular menstrual periods that are very heavy.    Hx: The following portions of the patient's history were reviewed and updated as appropriate:             She  has a past medical history of Anemia. She does not have any pertinent problems on file. She  has no past surgical history on file. Her family history includes Diabetes in her father and mother; Glaucoma in her mother; Hypertension in her father and mother; Seizures in her sister. She  reports that she quit smoking about 2 years ago. Her smoking use included cigarettes. She has never used smokeless tobacco. She reports that she does not currently use alcohol. She reports that she does not use drugs. She has a current medication list which includes the following prescription(s): multi-vitamin. She is allergic to amoxicillin.       Review of Systems:  Review of Systems  Constitutional: Denied constitutional symptoms, night sweats, recent illness, fatigue, fever, insomnia and weight loss.  Eyes: Denied eye symptoms, eye pain, photophobia, vision change and visual disturbance.  Ears/Nose/Throat/Neck: Denied ear, nose, throat or neck symptoms, hearing loss, nasal discharge, sinus congestion and sore throat.  Cardiovascular:  Denied cardiovascular symptoms, arrhythmia, chest pain/pressure, edema, exercise intolerance, orthopnea and palpitations.  Respiratory: Denied pulmonary symptoms, asthma, pleuritic pain, productive sputum, cough, dyspnea and wheezing.  Gastrointestinal: Denied, gastro-esophageal reflux, melena, nausea and vomiting.  Genitourinary: See HPI for additional information.  Musculoskeletal: Denied musculoskeletal symptoms, stiffness, swelling, muscle weakness and myalgia.  Dermatologic: Denied dermatology symptoms, rash and scar.  Neurologic: Denied neurology symptoms, dizziness, headache, neck pain and syncope.  Psychiatric: Denied psychiatric symptoms, anxiety and depression.  Endocrine: Denied endocrine symptoms including hot flashes and night sweats.   Meds:   Current Outpatient Medications on File Prior to Visit  Medication Sig Dispense Refill   Multiple Vitamin (MULTI-VITAMIN) tablet Take 1 tablet by mouth daily.     No current facility-administered medications on file prior to visit.      Objective:     Vitals:   03/28/22 1116  BP: 125/75  Pulse: 74   Filed Weights   03/28/22 1116  Weight: 179 lb 12.8 oz (81.6 kg)                        Assessment:    G0P0000 Patient Active Problem List   Diagnosis Date Noted   Iron deficiency anemia 02/25/2022     1. Menorrhagia with regular cycle     Her bleeding is so heavy that she has systematically dropped her hemoglobin to 4.9.  She states that her previous "record" when she was younger was a hemoglobin of 3.2. She has  recently received a transfusion and iron infusions and feels much better at this time.   Plan:            1.  We have discussed the cause of heavy menstrual bleeding including fibroids adenomyosis etc.  I have recommended cycle control.  We have discussed the possibility of endometrial ablation fibroid embolization and possible hysterectomy which would depend on future findings and symptoms. Because she is  interested in childbearing I believe the best course of action is pelvic ultrasound to delineate uterine fibroids or possible adenomyosis, I would follow this by a cycle control method most likely Mirena IUD or OCPs.  We have briefly discussed Myfembree. She has chosen to have the ultrasound and has selected IUD as a method as long as it is appropriate after the ultrasound.   Orders No orders of the defined types were placed in this encounter.   No orders of the defined types were placed in this encounter.     F/U  Return in about 4 weeks (around 04/25/2022). I spent 34 minutes involved in the care of this patient preparing to see the patient by obtaining and reviewing her medical history (including labs, imaging tests and prior procedures), documenting clinical information in the electronic health record (EHR), counseling and coordinating care plans, writing and sending prescriptions, ordering tests or procedures and in direct communicating with the patient and medical staff discussing pertinent items from her history and physical exam.  Finis Bud, M.D. 03/28/2022 11:54 AM

## 2022-03-28 NOTE — Progress Notes (Signed)
Patients presents for annual exam today. She states she currently has heavy menstrual cycles with severe cramping. She has been receiving iron infusions for anemia. Patient is due for pap smear, but has declined due to heavy bleeding at this time. Patient is up to date on mammogram. Patients annual labs declined at this time. Patient states no other questions or concerns at this time.

## 2022-03-29 ENCOUNTER — Inpatient Hospital Stay: Payer: BC Managed Care – PPO

## 2022-03-29 VITALS — BP 118/71 | HR 56 | Temp 97.5°F | Resp 18

## 2022-03-29 DIAGNOSIS — D509 Iron deficiency anemia, unspecified: Secondary | ICD-10-CM

## 2022-03-29 MED ORDER — SODIUM CHLORIDE 0.9 % IV SOLN: 200.0000 mg | Freq: Once | INTRAVENOUS | Status: DC

## 2022-03-29 MED ORDER — SODIUM CHLORIDE 0.9 % IV SOLN
Freq: Once | INTRAVENOUS | Status: AC
  Filled 2022-03-29: qty 250

## 2022-03-29 MED ORDER — IRON SUCROSE 20 MG/ML IV SOLN
200.0000 mg | Freq: Once | INTRAVENOUS | Status: AC
  Administered 2022-03-29: 200 mg via INTRAVENOUS
  Filled 2022-03-29: qty 10

## 2022-04-05 ENCOUNTER — Encounter: Payer: Self-pay | Admitting: Obstetrics and Gynecology

## 2022-04-06 ENCOUNTER — Encounter: Payer: BC Managed Care – PPO | Admitting: Obstetrics and Gynecology

## 2022-04-10 ENCOUNTER — Ambulatory Visit (INDEPENDENT_AMBULATORY_CARE_PROVIDER_SITE_OTHER): Payer: BC Managed Care – PPO

## 2022-04-10 DIAGNOSIS — N939 Abnormal uterine and vaginal bleeding, unspecified: Secondary | ICD-10-CM | POA: Diagnosis not present

## 2022-04-10 DIAGNOSIS — N920 Excessive and frequent menstruation with regular cycle: Secondary | ICD-10-CM

## 2022-04-15 NOTE — Progress Notes (Signed)
Based on these results, please encourage her to keep her next appointment on the 12th with me.

## 2022-04-24 ENCOUNTER — Encounter: Payer: Self-pay | Admitting: Oncology

## 2022-04-24 ENCOUNTER — Encounter: Payer: Self-pay | Admitting: Obstetrics and Gynecology

## 2022-04-24 ENCOUNTER — Ambulatory Visit (INDEPENDENT_AMBULATORY_CARE_PROVIDER_SITE_OTHER): Payer: 59 | Admitting: Obstetrics and Gynecology

## 2022-04-24 VITALS — BP 110/77 | HR 82 | Ht 70.0 in | Wt 181.8 lb

## 2022-04-24 DIAGNOSIS — N83292 Other ovarian cyst, left side: Secondary | ICD-10-CM

## 2022-04-24 DIAGNOSIS — N920 Excessive and frequent menstruation with regular cycle: Secondary | ICD-10-CM

## 2022-04-24 DIAGNOSIS — N83291 Other ovarian cyst, right side: Secondary | ICD-10-CM

## 2022-04-24 DIAGNOSIS — D219 Benign neoplasm of connective and other soft tissue, unspecified: Secondary | ICD-10-CM | POA: Diagnosis not present

## 2022-04-24 DIAGNOSIS — D250 Submucous leiomyoma of uterus: Secondary | ICD-10-CM | POA: Diagnosis not present

## 2022-04-24 NOTE — Progress Notes (Addendum)
HPI:      [name] is a 41 y.o. G0P0000 who LMP was Patient's last menstrual period was 03/27/2022.  Subjective:   She presents today to discuss her ultrasound findings.  She has significant dysmenorrhea and menorrhagia to the point where she drops her hemoglobin to 4.  She usually does not have intermenstrual pain or bleeding.     Hx: The following portions of the patient's history were reviewed and updated as appropriate:             She  has a past medical history of Anemia. She does not have any pertinent problems on file. She  has no past surgical history on file. Her family history includes Diabetes in her father and mother; Glaucoma in her mother; Hypertension in her father and mother; Seizures in her sister. She  reports that she quit smoking about 2 years ago. Her smoking use included cigarettes. She has never used smokeless tobacco. She reports that she does not currently use alcohol. She reports that she does not use drugs. She has a current medication list which includes the following prescription(s): multi-vitamin. She is allergic to amoxicillin.       Review of Systems:  Review of Systems  Constitutional: Denied constitutional symptoms, night sweats, recent illness, fatigue, fever, insomnia and weight loss.  Eyes: Denied eye symptoms, eye pain, photophobia, vision change and visual disturbance.  Ears/Nose/Throat/Neck: Denied ear, nose, throat or neck symptoms, hearing loss, nasal discharge, sinus congestion and sore throat.  Cardiovascular: Denied cardiovascular symptoms, arrhythmia, chest pain/pressure, edema, exercise intolerance, orthopnea and palpitations.  Respiratory: Denied pulmonary symptoms, asthma, pleuritic pain, productive sputum, cough, dyspnea and wheezing.  Gastrointestinal: Denied, gastro-esophageal reflux, melena, nausea and vomiting.  Genitourinary: See HPI for additional information.  Musculoskeletal: Denied musculoskeletal symptoms, stiffness,  swelling, muscle weakness and myalgia.  Dermatologic: Denied dermatology symptoms, rash and scar.  Neurologic: Denied neurology symptoms, dizziness, headache, neck pain and syncope.  Psychiatric: Denied psychiatric symptoms, anxiety and depression.  Endocrine: Denied endocrine symptoms including hot flashes and night sweats.   Meds:   Current Outpatient Medications on File Prior to Visit  Medication Sig Dispense Refill   Multiple Vitamin (MULTI-VITAMIN) tablet Take 1 tablet by mouth daily.     No current facility-administered medications on file prior to visit.      Objective:     Vitals:   04/24/22 0859  BP: 110/77  Pulse: 82   Filed Weights   04/24/22 0859  Weight: 181 lb 12.8 oz (82.5 kg)              Ultrasound findings reviewed directly with the patient          Assessment:    G0P0000 Patient Active Problem List   Diagnosis Date Noted   Iron deficiency anemia 02/25/2022     1. Fibroids   2. Menorrhagia with regular cycle   3. Fibroids, submucosal   4. Complex cyst of right ovary     Patient has a submucosal fibroid as well as multiple other fibroids.  These are most likely causing her dysmenorrhea and menorrhagia.  IUD not appropriate for management.  She smokes cigarettes so combination OCPs also not appropriate for hormonal management  Complex cysts most likely dermoids.  Possibly endometriomas.  Patient not symptomatic from these.      Plan:            1.  Discussed multiple options regarding her fibroids and complex ovarian cyst.  Recommend Roma testing  first.  Should this be negative the following options were specifically discussed.  Hysterectomy with bilateral oophorectomy.  Ovarian cystectomy with endometrial ablation or uterine fibroid embolization for cycle control methods with progesterone only pills or Nexplanon.  All risk benefits and options were discussed with the patient.  Questions were answered.  Orders Orders Placed This Encounter   Procedures   Ovarian Malignancy Risk-ROMA   Premenopausal Interp: HIGH   Postmenopausal Interp: HIGH    No orders of the defined types were placed in this encounter.     F/U  Return in about 2 weeks (around 05/08/2022) for We will contact her with any abnormal test results. I spent 33 minutes involved in the care of this patient preparing to see the patient by obtaining and reviewing her medical history (including labs, imaging tests and prior procedures), documenting clinical information in the electronic health record (EHR), counseling and coordinating care plans, writing and sending prescriptions, ordering tests or procedures and in direct communicating with the patient and medical staff discussing pertinent items from her history and physical exam.  Finis Bud, M.D. 05/08/2022 2:15 PM

## 2022-04-24 NOTE — Progress Notes (Signed)
Patient presents today to discuss recent ultrasound results. She states questions regarding birth control and next steps. No other concerns at this time.

## 2022-04-25 LAB — OVARIAN MALIGNANCY RISK-ROMA
Cancer Antigen (CA) 125: 122 U/mL — ABNORMAL HIGH (ref 0.0–38.1)
HE4: 68.2 pmol/L — ABNORMAL HIGH (ref 0.0–63.6)
Postmenopausal ROMA: 4.55 — ABNORMAL HIGH
Premenopausal ROMA: 1.61 — ABNORMAL HIGH

## 2022-04-25 LAB — PREMENOPAUSAL INTERP: HIGH

## 2022-04-25 LAB — POSTMENOPAUSAL INTERP: HIGH

## 2022-05-03 ENCOUNTER — Other Ambulatory Visit: Payer: Self-pay

## 2022-05-03 DIAGNOSIS — N838 Other noninflammatory disorders of ovary, fallopian tube and broad ligament: Secondary | ICD-10-CM

## 2022-05-03 NOTE — Progress Notes (Signed)
CT ordered per Dr.Evans due to complex ovarian mass. Order placed to [facility].

## 2022-05-04 ENCOUNTER — Encounter: Payer: Self-pay | Admitting: Oncology

## 2022-05-08 ENCOUNTER — Ambulatory Visit (INDEPENDENT_AMBULATORY_CARE_PROVIDER_SITE_OTHER): Payer: 59 | Admitting: Obstetrics and Gynecology

## 2022-05-08 ENCOUNTER — Other Ambulatory Visit: Payer: Self-pay

## 2022-05-08 ENCOUNTER — Encounter: Payer: Self-pay | Admitting: Obstetrics and Gynecology

## 2022-05-08 VITALS — BP 115/79 | HR 76 | Ht 70.0 in | Wt 186.6 lb

## 2022-05-08 DIAGNOSIS — R978 Other abnormal tumor markers: Secondary | ICD-10-CM

## 2022-05-08 DIAGNOSIS — N838 Other noninflammatory disorders of ovary, fallopian tube and broad ligament: Secondary | ICD-10-CM

## 2022-05-08 NOTE — Progress Notes (Signed)
HPI:      [name] is a 41 y.o. G0P0000 who LMP was Patient's last menstrual period was 04/21/2022.  Subjective:   She presents today to discuss her tumor marker results showing elevated He4 and an elevated CA125.  She has right sided complex ovarian cysts.  The cysts are asymptomatic. She has known uterine fibroids causing significant anemia!    Hx: The following portions of the patient's history were reviewed and updated as appropriate:             She  has a past medical history of Anemia. She does not have any pertinent problems on file. She  has no past surgical history on file. Her family history includes Diabetes in her father and mother; Glaucoma in her mother; Hypertension in her father and mother; Seizures in her sister. She  reports that she quit smoking about 2 years ago. Her smoking use included cigarettes. She has never used smokeless tobacco. She reports that she does not currently use alcohol. She reports that she does not use drugs. She has a current medication list which includes the following prescription(s): multi-vitamin. She is allergic to amoxicillin.       Review of Systems:  Review of Systems  Constitutional: Denied constitutional symptoms, night sweats, recent illness, fatigue, fever, insomnia and weight loss.  Eyes: Denied eye symptoms, eye pain, photophobia, vision change and visual disturbance.  Ears/Nose/Throat/Neck: Denied ear, nose, throat or neck symptoms, hearing loss, nasal discharge, sinus congestion and sore throat.  Cardiovascular: Denied cardiovascular symptoms, arrhythmia, chest pain/pressure, edema, exercise intolerance, orthopnea and palpitations.  Respiratory: Denied pulmonary symptoms, asthma, pleuritic pain, productive sputum, cough, dyspnea and wheezing.  Gastrointestinal: Denied, gastro-esophageal reflux, melena, nausea and vomiting.  Genitourinary: See HPI for additional information.  Musculoskeletal: Denied musculoskeletal  symptoms, stiffness, swelling, muscle weakness and myalgia.  Dermatologic: Denied dermatology symptoms, rash and scar.  Neurologic: Denied neurology symptoms, dizziness, headache, neck pain and syncope.  Psychiatric: Denied psychiatric symptoms, anxiety and depression.  Endocrine: Denied endocrine symptoms including hot flashes and night sweats.   Meds:   Current Outpatient Medications on File Prior to Visit  Medication Sig Dispense Refill   Multiple Vitamin (MULTI-VITAMIN) tablet Take 1 tablet by mouth daily.     No current facility-administered medications on file prior to visit.      Objective:     Vitals:   05/08/22 1405  BP: 115/79  Pulse: 76   Filed Weights   05/08/22 1405  Weight: 186 lb 9.6 oz (84.6 kg)                        Assessment:    G0P0000 Patient Active Problem List   Diagnosis Date Noted   Iron deficiency anemia 02/25/2022     1. Ovarian mass   2. Elevated tumor markers     We have discussed ovarian masses and elevated tumor markers in someone her age.  The fact that the cysts are complex and that she has elevated tumor markers increases her risk for ovarian cancer.  Her age decreases her risk significantly.  I have previously discussed management of this with Dr. Fransisca Connors from GYN oncology.  I have discussed this directly with her.   Plan:            1.  CT to better characterize right ovarian cysts.  2.  After CT results return we will again consult Dr. Fransisca Connors for evaluation.  3.  The possibility  of oophorectomy versus bilateral oophorectomy versus RSO with hysterectomy for uterine fibroids versus removal of all pelvic organs lymph node sampling and abdominal washings discussed. She brought a list of questions that were excellent questions.  We walked through each question and I answered them. I believe she has a good understanding of her current condition regarding both her uterine fibroids, her complex ovarian cysts, and her elevated  tumor markers. Orders No orders of the defined types were placed in this encounter.   No orders of the defined types were placed in this encounter.     F/U  No follow-ups on file. I spent 42 minutes involved in the care of this patient preparing to see the patient by obtaining and reviewing her medical history (including labs, imaging tests and prior procedures), documenting clinical information in the electronic health record (EHR), counseling and coordinating care plans, writing and sending prescriptions, ordering tests or procedures and in direct communicating with the patient and medical staff discussing pertinent items from her history and physical exam.  Finis Bud, M.D. 05/08/2022 3:21 PM

## 2022-05-08 NOTE — Progress Notes (Signed)
Patient presents today to discuss recent [place] lab work. She states questions regarding results and next steps.

## 2022-05-11 ENCOUNTER — Other Ambulatory Visit: Payer: Self-pay

## 2022-05-11 DIAGNOSIS — N838 Other noninflammatory disorders of ovary, fallopian tube and broad ligament: Secondary | ICD-10-CM

## 2022-05-22 ENCOUNTER — Encounter: Payer: Self-pay | Admitting: Oncology

## 2022-05-25 ENCOUNTER — Encounter: Payer: Self-pay | Admitting: Oncology

## 2022-05-25 DIAGNOSIS — N920 Excessive and frequent menstruation with regular cycle: Secondary | ICD-10-CM | POA: Diagnosis not present

## 2022-05-25 DIAGNOSIS — N6320 Unspecified lump in the left breast, unspecified quadrant: Secondary | ICD-10-CM | POA: Diagnosis not present

## 2022-05-25 DIAGNOSIS — D500 Iron deficiency anemia secondary to blood loss (chronic): Secondary | ICD-10-CM | POA: Diagnosis not present

## 2022-05-25 DIAGNOSIS — N921 Excessive and frequent menstruation with irregular cycle: Secondary | ICD-10-CM | POA: Diagnosis not present

## 2022-05-28 ENCOUNTER — Other Ambulatory Visit: Payer: Self-pay | Admitting: Internal Medicine

## 2022-05-28 DIAGNOSIS — N63 Unspecified lump in unspecified breast: Secondary | ICD-10-CM

## 2022-05-29 ENCOUNTER — Other Ambulatory Visit: Payer: BC Managed Care – PPO

## 2022-05-29 ENCOUNTER — Ambulatory Visit
Admission: RE | Admit: 2022-05-29 | Discharge: 2022-05-29 | Disposition: A | Discharge status: Home / Self Care | Payer: 59 | Source: Ambulatory Visit | Attending: Obstetrics and Gynecology | Admitting: Obstetrics and Gynecology

## 2022-05-29 DIAGNOSIS — N838 Other noninflammatory disorders of ovary, fallopian tube and broad ligament: Secondary | ICD-10-CM

## 2022-05-29 DIAGNOSIS — D251 Intramural leiomyoma of uterus: Secondary | ICD-10-CM | POA: Diagnosis not present

## 2022-05-29 DIAGNOSIS — N83201 Unspecified ovarian cyst, right side: Secondary | ICD-10-CM | POA: Diagnosis not present

## 2022-05-29 MED ORDER — GADOBENATE DIMEGLUMINE 529 MG/ML IV SOLN
17.0000 mL | Freq: Once | INTRAVENOUS | Status: AC | PRN
  Administered 2022-05-29: 17 mL via INTRAVENOUS

## 2022-05-30 ENCOUNTER — Encounter: Payer: BC Managed Care – PPO | Admitting: Obstetrics and Gynecology

## 2022-06-13 ENCOUNTER — Inpatient Hospital Stay: Payer: 59 | Attending: Oncology

## 2022-06-13 DIAGNOSIS — N920 Excessive and frequent menstruation with regular cycle: Secondary | ICD-10-CM | POA: Insufficient documentation

## 2022-06-13 DIAGNOSIS — D509 Iron deficiency anemia, unspecified: Secondary | ICD-10-CM | POA: Diagnosis not present

## 2022-06-13 LAB — CBC WITH DIFFERENTIAL/PLATELET
Abs Immature Granulocytes: 0.02 10*3/uL (ref 0.00–0.07)
Basophils Absolute: 0.1 10*3/uL (ref 0.0–0.1)
Basophils Relative: 1 %
Eosinophils Absolute: 0.1 10*3/uL (ref 0.0–0.5)
Eosinophils Relative: 1 %
HCT: 34.7 % — ABNORMAL LOW (ref 36.0–46.0)
Hemoglobin: 10.5 g/dL — ABNORMAL LOW (ref 12.0–15.0)
Immature Granulocytes: 0 %
Lymphocytes Relative: 48 %
Lymphs Abs: 2.7 10*3/uL (ref 0.7–4.0)
MCH: 23 pg — ABNORMAL LOW (ref 26.0–34.0)
MCHC: 30.3 g/dL (ref 30.0–36.0)
MCV: 75.9 fL — ABNORMAL LOW (ref 80.0–100.0)
Monocytes Absolute: 0.5 10*3/uL (ref 0.1–1.0)
Monocytes Relative: 8 %
Neutro Abs: 2.4 10*3/uL (ref 1.7–7.7)
Neutrophils Relative %: 42 %
Platelets: 382 10*3/uL (ref 150–400)
RBC: 4.57 MIL/uL (ref 3.87–5.11)
RDW: 17.6 % — ABNORMAL HIGH (ref 11.5–15.5)
WBC: 5.7 10*3/uL (ref 4.0–10.5)
nRBC: 0 % (ref 0.0–0.2)

## 2022-06-13 LAB — FERRITIN: Ferritin: 6 ng/mL — ABNORMAL LOW (ref 11–307)

## 2022-06-13 LAB — SAMPLE TO BLOOD BANK

## 2022-06-13 LAB — IRON AND TIBC
Iron: 25 ug/dL — ABNORMAL LOW (ref 28–170)
Saturation Ratios: 6 % — ABNORMAL LOW (ref 10.4–31.8)
TIBC: 409 ug/dL (ref 250–450)
UIBC: 384 ug/dL

## 2022-06-13 NOTE — Progress Notes (Signed)
[facility]  [phone]) [phone] [phone]) [phone]  ID: Krista Holloway OB: Dec 04, 1980  MR#: [id number]  HAL#:[id number]  Patient Care Team: Perrin Maltese, MD as PCP - General (Internal Medicine)  CHIEF COMPLAINT: Iron deficiency anemia.   INTERVAL HISTORY: Patient returns to clinic today for repeat laboratory work, further evaluation, and consideration of IV Venofer.  She currently feels well and is asymptomatic.  She does not complain of weakness or fatigue today. She has no neurologic complaints.  She denies any recent fevers or illnesses.  She has a good appetite and denies weight loss.  She has no chest pain, shortness of breath, cough, or hemoptysis.  She denies any nausea, vomiting, constipation, or diarrhea.  She has no melena or hematochezia.  She has no urinary complaints.  Patient offers no specific complaints today.    REVIEW OF SYSTEMS:   Review of Systems  Constitutional: Negative.  Negative for fever, malaise/fatigue and weight loss.  Respiratory: Negative.  Negative for cough, hemoptysis and shortness of breath.   Cardiovascular: Negative.  Negative for chest pain and leg swelling.  Gastrointestinal: Negative.  Negative for abdominal pain, blood in stool and melena.  Genitourinary: Negative.  Negative for hematuria.  Musculoskeletal: Negative.  Negative for back pain.  Skin: Negative.  Negative for rash.  Neurological: Negative.  Negative for dizziness, focal weakness and weakness.  Psychiatric/Behavioral: Negative.  The patient is not nervous/anxious.     As per HPI. Otherwise, a complete review of systems is negative.  PAST MEDICAL HISTORY: Past Medical History:  Diagnosis Date   Anemia     PAST SURGICAL HISTORY: History reviewed. No pertinent surgical history.  FAMILY HISTORY: Family History  Problem Relation Age of Onset   Diabetes Mother    Glaucoma Mother    Hypertension Mother    Diabetes Father    Hypertension Father     Seizures Sister    Breast cancer Neg Hx     ADVANCED DIRECTIVES (Y/N):  N  HEALTH MAINTENANCE: Social History   Tobacco Use   Smoking status: Former    Types: Cigarettes    Quit date: 2021    Years since quitting: 2.6   Smokeless tobacco: Never  Vaping Use   Vaping Use: Every day   Substances: Nicotine  Substance Use Topics   Alcohol use: Not Currently    Comment: socially   Drug use: Never     Colonoscopy:  PAP:  Bone density:  Lipid panel:  Allergies  Allergen Reactions   Amoxicillin     Other reaction(s): Unknown Other reaction(s): Unknown     Current Outpatient Medications  Medication Sig Dispense Refill   Multiple Vitamin (MULTI-VITAMIN) tablet Take 1 tablet by mouth daily.     No current facility-administered medications for this visit.    OBJECTIVE: Vitals:   06/14/22 1436  BP: 113/70  Pulse: 90  Resp: 18  Temp: 98.3 F (36.8 C)  SpO2: 100%     Body mass index is 27.66 kg/m.    ECOG FS:0 - Asymptomatic  General: Well-developed, well-nourished, no acute distress. Eyes: Pink conjunctiva, anicteric sclera. HEENT: Normocephalic, moist mucous membranes. Lungs: No audible wheezing or coughing. Heart: Regular rate and rhythm. Abdomen: Soft, nontender, no obvious distention. Musculoskeletal: No edema, cyanosis, or clubbing. Neuro: Alert, answering all questions appropriately. Cranial nerves grossly intact. Skin: No rashes or petechiae noted. Psych: Normal affect.   LAB RESULTS:  No results found for: "NA", "K", "CL", "CO2", "GLUCOSE", "BUN", "CREATININE", "CALCIUM", "PROT", "ALBUMIN", "  AST", "ALT", "ALKPHOS", "BILITOT", "GFRNONAA", "GFRAA"  Lab Results  Component Value Date   WBC 5.7 06/13/2022   NEUTROABS 2.4 06/13/2022   HGB 10.5 (L) 06/13/2022   HCT 34.7 (L) 06/13/2022   MCV 75.9 (L) 06/13/2022   PLT 382 06/13/2022   Lab Results  Component Value Date   IRON 25 (L) 06/13/2022   TIBC 409 06/13/2022   IRONPCTSAT 6 (L) 06/13/2022    Lab Results  Component Value Date   FERRITIN 6 (L) 06/13/2022     STUDIES: MR PELVIS W WO CONTRAST  Result Date: 05/31/2022 CLINICAL DATA:  Ovarian mass. EXAM: MRI PELVIS WITHOUT AND WITH CONTRAST TECHNIQUE: Multiplanar multisequence MR imaging of the pelvis was performed both before and after administration of intravenous contrast. CONTRAST:  66m MULTIHANCE GADOBENATE DIMEGLUMINE 529 MG/ML IV SOLN COMPARISON:  Pelvic ultrasound 04/10/2022 FINDINGS: Urinary Tract: Unremarkable urinary bladder. Bowel: Unremarkable pelvic bowel loops. Vascular/Lymphatic: No pathologically enlarged lymph nodes or other significant abnormality. Reproductive: Uterus: Measures 12.7 x 6.8 x 8.2 cm. Multiple fibroids evident including dominant 4.6 cm intramural fundal fibroid. 3.7 cm posterior right intramural fibroid identified towards the fundus. 2.8 cm intramural fibroid identified anterior mid uterus. 1.8 cm right fundal fibroid has a submucosal component. Numerous additional scattered smaller uterine fibroids evident. Endometrial thickness is measured at 6 mm. Junctional zone largely obscured by relatively heavy fibroid burden. Right ovary: 9.1 x 6.1 x 6.3 cm posterior right adnexal mass has a multicystic quality. There does appear to be some normal ovarian parenchyma along the posterior margin of this lesion (image 12/series 4). The walls of the different cystic components are thin and smooth with perceptible enhancement. No evidence for mural nodule or papillary projection. The difference cystic component show varying signal intensity on T1 and T2 imaging consistent with varying degrees of proteinaceous debris or hemorrhage. Dominant cystic component measures up to 6.2 cm diameter and show some "T2 shading" on axial T2 imaging. Left ovary:  2.7 x 2.9 x 3.5 cm.  Multiple follicles evident. Other: Small volume free fluid noted in the cul-de-sac. Musculoskeletal: No focal suspicious marrow enhancement within the visualized  bony anatomy. IMPRESSION: 1. 9.1 x 6.1 x 6.3 cm posterior right adnexal cystic mass appears to arise from the right ovary. This has a multiloculated appearance with cystic components having thin, smooth walls and no evidence for mural nodularity or papillary projection. Differential signal intensity within the different cystic components of the lesion is compatible with varying degrees of proteinaceous debris or hemorrhage. 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 2. No evidence for lymphadenopathy in the pelvis. There is a small amount of free fluid in the cul-de-sac. 3. Large fibroid burden in the uterus. 4. Unremarkable left ovary. Electronically Signed   By: EMisty StanleyM.D.   On: 05/31/2022 08:41    ASSESSMENT:   Iron deficiency anemia.   PLAN:    Iron deficiency anemia: Likely secondary to heavy menses.  Patient remains anemic, but her hemoglobin has improved to 10.5.  Her iron stores remain significantly reduced.  She does not require transfusion today, but will proceed with IV Venofer.  She will receive 4 additional doses over the next several weeks.  Patient will then return to clinic in 3 months with repeat laboratory work, further evaluation, and continuation of treatment if needed.   Leukopenia: Resolved. Thrombocytosis: Resolved. Heavy menses: MRI results reviewed independently and reported as above.  Patient also noted to have a large right adnexal cystic mass which will likely  require surgical intervention.    Patient expressed understanding and was in agreement with this plan. She also understands that She can call clinic at any time with any questions, concerns, or complaints.    Lloyd Huger, MD   06/17/2022 7:44 AM

## 2022-06-14 ENCOUNTER — Inpatient Hospital Stay: Payer: 59

## 2022-06-14 ENCOUNTER — Encounter: Payer: Self-pay | Admitting: Obstetrics and Gynecology

## 2022-06-14 ENCOUNTER — Encounter: Payer: Self-pay | Admitting: Oncology

## 2022-06-14 ENCOUNTER — Inpatient Hospital Stay (HOSPITAL_BASED_OUTPATIENT_CLINIC_OR_DEPARTMENT_OTHER): Payer: 59 | Admitting: Oncology

## 2022-06-14 ENCOUNTER — Ambulatory Visit: Payer: 59 | Admitting: Obstetrics and Gynecology

## 2022-06-14 VITALS — BP 113/70 | HR 90 | Temp 98.3°F | Resp 18 | Wt 192.8 lb

## 2022-06-14 VITALS — BP 118/71 | HR 85 | Resp 16

## 2022-06-14 VITALS — BP 123/84 | HR 76 | Ht 70.0 in | Wt 192.8 lb

## 2022-06-14 DIAGNOSIS — D219 Benign neoplasm of connective and other soft tissue, unspecified: Secondary | ICD-10-CM

## 2022-06-14 DIAGNOSIS — N920 Excessive and frequent menstruation with regular cycle: Secondary | ICD-10-CM | POA: Diagnosis not present

## 2022-06-14 DIAGNOSIS — D509 Iron deficiency anemia, unspecified: Secondary | ICD-10-CM | POA: Diagnosis not present

## 2022-06-14 DIAGNOSIS — N838 Other noninflammatory disorders of ovary, fallopian tube and broad ligament: Secondary | ICD-10-CM | POA: Diagnosis not present

## 2022-06-14 DIAGNOSIS — R978 Other abnormal tumor markers: Secondary | ICD-10-CM | POA: Diagnosis not present

## 2022-06-14 MED ORDER — SODIUM CHLORIDE 0.9 % IV SOLN
200.0000 mg | Freq: Once | INTRAVENOUS | Status: AC
  Administered 2022-06-14: 200 mg via INTRAVENOUS
  Filled 2022-06-14: qty 200

## 2022-06-14 MED ORDER — SODIUM CHLORIDE 0.9 % IV SOLN
Freq: Once | INTRAVENOUS | Status: AC
  Filled 2022-06-14: qty 250

## 2022-06-14 NOTE — Progress Notes (Signed)
Patient presents today to discuss recent ultrasound results. She denies pain or any abnormal bleeding. No concerns today.

## 2022-06-14 NOTE — Progress Notes (Signed)
HPI:      [name] is a 41 y.o. G0P0000 who LMP was Patient's last menstrual period was 06/11/2022.  Subjective:   She presents today for follow-up of her MR showing complex ovarian cyst.  Her work-up has included Ca125 and HE4.  Both of these were found to be elevated. She also has a history of uterine fibroids and has experienced heavy menstrual periods with cramping in the past.    Hx: The following portions of the patient's history were reviewed and updated as appropriate:             She  has a past medical history of Anemia. She does not have any pertinent problems on file. She  has no past surgical history on file. Her family history includes Diabetes in her father and mother; Glaucoma in her mother; Hypertension in her father and mother; Seizures in her sister. She  reports that she quit smoking about 2 years ago. Her smoking use included cigarettes. She has never used smokeless tobacco. She reports that she does not currently use alcohol. She reports that she does not use drugs. She has a current medication list which includes the following prescription(s): multi-vitamin. She is allergic to amoxicillin.       Review of Systems:  Review of Systems  Constitutional: Denied constitutional symptoms, night sweats, recent illness, fatigue, fever, insomnia and weight loss.  Eyes: Denied eye symptoms, eye pain, photophobia, vision change and visual disturbance.  Ears/Nose/Throat/Neck: Denied ear, nose, throat or neck symptoms, hearing loss, nasal discharge, sinus congestion and sore throat.  Cardiovascular: Denied cardiovascular symptoms, arrhythmia, chest pain/pressure, edema, exercise intolerance, orthopnea and palpitations.  Respiratory: Denied pulmonary symptoms, asthma, pleuritic pain, productive sputum, cough, dyspnea and wheezing.  Gastrointestinal: Denied, gastro-esophageal reflux, melena, nausea and vomiting.  Genitourinary: See HPI for additional information.   Musculoskeletal: Denied musculoskeletal symptoms, stiffness, swelling, muscle weakness and myalgia.  Dermatologic: Denied dermatology symptoms, rash and scar.  Neurologic: Denied neurology symptoms, dizziness, headache, neck pain and syncope.  Psychiatric: Denied psychiatric symptoms, anxiety and depression.  Endocrine: Denied endocrine symptoms including hot flashes and night sweats.   Meds:   Current Outpatient Medications on File Prior to Visit  Medication Sig Dispense Refill   Multiple Vitamin (MULTI-VITAMIN) tablet Take 1 tablet by mouth daily.     No current facility-administered medications on file prior to visit.      Objective:     Vitals:   06/14/22 0852  BP: 123/84  Pulse: 76   Filed Weights   06/14/22 0852  Weight: 192 lb 12.8 oz (87.5 kg)              MR findings discussed directly with the patient          Assessment:    G0P0000 Patient Active Problem List   Diagnosis Date Noted   Iron deficiency anemia 02/25/2022     1. Ovarian mass   2. Elevated tumor markers   3. 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  Low likelihood of malignancy.   Plan:            1.  Discussed entire work-up and patient with Dr. Fransisca Connors.  He believes that this is benign, as do I.  Unilateral oophorectomy versus LAVH with unilateral oophorectomy discussed in detail with the patient.  Benefits of each discussed.  Risks of each discussed.  She would like some time to make up her mind. All questions  answered. Orders No orders of the defined types were placed in this encounter.   No orders of the defined types were placed in this encounter.     F/U  No follow-ups on file. I spent 22 minutes involved in the care of this patient preparing to see the patient by obtaining and reviewing her medical history (including labs, imaging tests and prior procedures), documenting clinical information in the electronic health record (EHR), counseling  and coordinating care plans, writing and sending prescriptions, ordering tests or procedures and in direct communicating with the patient and medical staff discussing pertinent items from her history and physical exam.  Finis Bud, M.D. 06/14/2022 9:12 AM

## 2022-06-15 ENCOUNTER — Encounter: Payer: Self-pay | Admitting: Obstetrics and Gynecology

## 2022-06-17 ENCOUNTER — Encounter: Payer: Self-pay | Admitting: Oncology

## 2022-06-20 ENCOUNTER — Ambulatory Visit
Admission: RE | Admit: 2022-06-20 | Discharge: 2022-06-20 | Disposition: A | Discharge status: Home / Self Care | Payer: 59 | Source: Ambulatory Visit | Attending: Internal Medicine | Admitting: Internal Medicine

## 2022-06-20 DIAGNOSIS — N63 Unspecified lump in unspecified breast: Secondary | ICD-10-CM

## 2022-06-20 DIAGNOSIS — R928 Other abnormal and inconclusive findings on diagnostic imaging of breast: Secondary | ICD-10-CM | POA: Diagnosis not present

## 2022-06-20 DIAGNOSIS — N6002 Solitary cyst of left breast: Secondary | ICD-10-CM | POA: Diagnosis not present

## 2022-06-26 ENCOUNTER — Inpatient Hospital Stay: Payer: 59 | Attending: Oncology

## 2022-06-26 VITALS — BP 131/76 | HR 58 | Temp 97.0°F | Resp 18

## 2022-06-26 DIAGNOSIS — N920 Excessive and frequent menstruation with regular cycle: Secondary | ICD-10-CM | POA: Diagnosis not present

## 2022-06-26 DIAGNOSIS — D509 Iron deficiency anemia, unspecified: Secondary | ICD-10-CM | POA: Diagnosis not present

## 2022-06-26 MED ORDER — SODIUM CHLORIDE 0.9 % IV SOLN
200.0000 mg | Freq: Once | INTRAVENOUS | Status: AC
  Administered 2022-06-26: 200 mg via INTRAVENOUS
  Filled 2022-06-26: qty 200

## 2022-06-26 MED ORDER — SODIUM CHLORIDE 0.9 % IV SOLN
INTRAVENOUS | Status: DC
  Filled 2022-06-26: qty 250

## 2022-06-27 MED FILL — Iron Sucrose Inj 20 MG/ML (Fe Equiv): INTRAVENOUS | Qty: 10 | Status: AC

## 2022-06-28 ENCOUNTER — Inpatient Hospital Stay: Payer: 59

## 2022-06-28 VITALS — BP 134/76 | HR 60 | Temp 97.5°F | Resp 18

## 2022-06-28 DIAGNOSIS — D509 Iron deficiency anemia, unspecified: Secondary | ICD-10-CM | POA: Diagnosis not present

## 2022-06-28 DIAGNOSIS — N920 Excessive and frequent menstruation with regular cycle: Secondary | ICD-10-CM | POA: Diagnosis not present

## 2022-06-28 MED ORDER — SODIUM CHLORIDE 0.9 % IV SOLN
200.0000 mg | Freq: Once | INTRAVENOUS | Status: AC
  Administered 2022-06-28: 200 mg via INTRAVENOUS
  Filled 2022-06-28: qty 200

## 2022-06-28 MED ORDER — SODIUM CHLORIDE 0.9 % IV SOLN
Freq: Once | INTRAVENOUS | Status: AC
  Filled 2022-06-28: qty 250

## 2022-07-03 ENCOUNTER — Inpatient Hospital Stay: Payer: 59

## 2022-07-03 VITALS — BP 136/71 | HR 59 | Temp 97.3°F | Resp 18

## 2022-07-03 DIAGNOSIS — N920 Excessive and frequent menstruation with regular cycle: Secondary | ICD-10-CM | POA: Diagnosis not present

## 2022-07-03 DIAGNOSIS — D509 Iron deficiency anemia, unspecified: Secondary | ICD-10-CM

## 2022-07-03 MED ORDER — SODIUM CHLORIDE 0.9 % IV SOLN
200.0000 mg | Freq: Once | INTRAVENOUS | Status: AC
  Administered 2022-07-03: 200 mg via INTRAVENOUS
  Filled 2022-07-03: qty 200

## 2022-07-03 MED ORDER — SODIUM CHLORIDE 0.9 % IV SOLN
INTRAVENOUS | Status: DC
  Filled 2022-07-03: qty 250

## 2022-07-04 MED FILL — Iron Sucrose Inj 20 MG/ML (Fe Equiv): INTRAVENOUS | Qty: 10 | Status: AC

## 2022-07-05 ENCOUNTER — Inpatient Hospital Stay: Payer: 59

## 2022-07-05 DIAGNOSIS — Z03818 Encounter for observation for suspected exposure to other biological agents ruled out: Secondary | ICD-10-CM | POA: Diagnosis not present

## 2022-07-05 DIAGNOSIS — D509 Iron deficiency anemia, unspecified: Secondary | ICD-10-CM | POA: Diagnosis not present

## 2022-07-05 DIAGNOSIS — J00 Acute nasopharyngitis [common cold]: Secondary | ICD-10-CM | POA: Diagnosis not present

## 2022-07-05 DIAGNOSIS — Z6827 Body mass index (BMI) 27.0-27.9, adult: Secondary | ICD-10-CM | POA: Diagnosis not present

## 2022-07-05 DIAGNOSIS — N920 Excessive and frequent menstruation with regular cycle: Secondary | ICD-10-CM | POA: Diagnosis not present

## 2022-07-05 DIAGNOSIS — M791 Myalgia, unspecified site: Secondary | ICD-10-CM | POA: Diagnosis not present

## 2022-07-05 MED ORDER — SODIUM CHLORIDE 0.9 % IV SOLN
200.0000 mg | Freq: Once | INTRAVENOUS | Status: AC
  Administered 2022-07-05: 200 mg via INTRAVENOUS
  Filled 2022-07-05: qty 200

## 2022-07-05 MED ORDER — SODIUM CHLORIDE 0.9 % IV SOLN
Freq: Once | INTRAVENOUS | Status: AC
  Filled 2022-07-05: qty 250

## 2022-07-16 DIAGNOSIS — H40013 Open angle with borderline findings, low risk, bilateral: Secondary | ICD-10-CM | POA: Diagnosis not present

## 2022-07-16 DIAGNOSIS — H52223 Regular astigmatism, bilateral: Secondary | ICD-10-CM | POA: Diagnosis not present

## 2022-07-16 DIAGNOSIS — H5213 Myopia, bilateral: Secondary | ICD-10-CM | POA: Diagnosis not present

## 2022-07-23 DIAGNOSIS — H5213 Myopia, bilateral: Secondary | ICD-10-CM | POA: Diagnosis not present

## 2022-07-23 DIAGNOSIS — H40013 Open angle with borderline findings, low risk, bilateral: Secondary | ICD-10-CM | POA: Diagnosis not present

## 2022-07-23 DIAGNOSIS — H52223 Regular astigmatism, bilateral: Secondary | ICD-10-CM | POA: Diagnosis not present

## 2022-08-16 ENCOUNTER — Telehealth: Payer: Self-pay

## 2022-08-16 NOTE — Telephone Encounter (Signed)
Pt calling wanting to schedule her surgery. Advised her I would send to Dr Amalia Hailey and surgery scheduler

## 2022-08-17 NOTE — Telephone Encounter (Signed)
Patient is scheduled for 08/29/22 with [facility] for pre op

## 2022-08-24 DIAGNOSIS — R030 Elevated blood-pressure reading, without diagnosis of hypertension: Secondary | ICD-10-CM | POA: Diagnosis not present

## 2022-08-24 DIAGNOSIS — D500 Iron deficiency anemia secondary to blood loss (chronic): Secondary | ICD-10-CM | POA: Diagnosis not present

## 2022-08-29 ENCOUNTER — Ambulatory Visit (INDEPENDENT_AMBULATORY_CARE_PROVIDER_SITE_OTHER): Payer: 59 | Admitting: Obstetrics and Gynecology

## 2022-08-29 ENCOUNTER — Encounter: Payer: Self-pay | Admitting: Obstetrics and Gynecology

## 2022-08-29 VITALS — BP 142/87 | HR 63 | Ht 70.0 in | Wt 209.1 lb

## 2022-08-29 DIAGNOSIS — N838 Other noninflammatory disorders of ovary, fallopian tube and broad ligament: Secondary | ICD-10-CM | POA: Diagnosis not present

## 2022-08-29 DIAGNOSIS — Z01818 Encounter for other preprocedural examination: Secondary | ICD-10-CM

## 2022-08-29 DIAGNOSIS — R978 Other abnormal tumor markers: Secondary | ICD-10-CM | POA: Diagnosis not present

## 2022-08-29 NOTE — Progress Notes (Signed)
PRE-OPERATIVE HISTORY AND PHYSICAL EXAM  PCP:  Perrin Maltese, MD Subjective:   HPI:  Krista Holloway is a 41 y.o. G0P0000.  No LMP recorded.  She presents today for a pre-op discussion and PE.  She has the following symptoms: Complex right ovarian cyst.  Examined by both ultrasound and MRI.  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  Thought to be benign by Dr. Fransisca Connors (GYN oncology).  Patient has multiple uterine fibroids.  Review of Systems:   Constitutional: Denied constitutional symptoms, night sweats, recent illness, fatigue, fever, insomnia and weight loss.  Eyes: Denied eye symptoms, eye pain, photophobia, vision change and visual disturbance.  Ears/Nose/Throat/Neck: Denied ear, nose, throat or neck symptoms, hearing loss, nasal discharge, sinus congestion and sore throat.  Cardiovascular: Denied cardiovascular symptoms, arrhythmia, chest pain/pressure, edema, exercise intolerance, orthopnea and palpitations.  Respiratory: Denied pulmonary symptoms, asthma, pleuritic pain, productive sputum, cough, dyspnea and wheezing.  Gastrointestinal: Denied, gastro-esophageal reflux, melena, nausea and vomiting.  Genitourinary: See HPI for additional information.  Musculoskeletal: Denied musculoskeletal symptoms, stiffness, swelling, muscle weakness and myalgia.  Dermatologic: Denied dermatology symptoms, rash and scar.  Neurologic: Denied neurology symptoms, dizziness, headache, neck pain and syncope.  Psychiatric: Denied psychiatric symptoms, anxiety and depression.  Endocrine: Denied endocrine symptoms including hot flashes and night sweats.   OB History  Gravida Para Term Preterm AB Living  0 0 0 0 0 0  SAB IAB Ectopic Multiple Live Births  0 0 0 0 0    Past Medical History:  Diagnosis Date   Anemia     History reviewed. No pertinent surgical history.    SOCIAL HISTORY:  Social History   Tobacco Use  Smoking Status Former   Types: Cigarettes   Quit date: 2021   Years since  quitting: 2.8  Smokeless Tobacco Never   Social History   Substance and Sexual Activity  Alcohol Use Not Currently   Comment: socially    Social History   Substance and Sexual Activity  Drug Use Never    Family History  Problem Relation Age of Onset   Diabetes Mother    Glaucoma Mother    Hypertension Mother    Diabetes Father    Hypertension Father    Seizures Sister    Breast cancer Neg Hx     ALLERGIES:  Amoxicillin  MEDS:   Current Outpatient Medications on File Prior to Visit  Medication Sig Dispense Refill   Multiple Vitamin (MULTI-VITAMIN) tablet Take 1 tablet by mouth daily.     No current facility-administered medications on file prior to visit.    No orders of the defined types were placed in this encounter.    Physical examination BP (!) 142/87   Pulse 63   Ht '5\' 10"'$  (1.778 m)   Wt 209 lb 1.6 oz (94.8 kg)   BMI 30.00 kg/m   General NAD, Conversant  HEENT Atraumatic; Op clear with mmm.  Normo-cephalic. Pupils reactive. Anicteric sclerae  Thyroid/Neck Smooth without nodularity or enlargement. Normal ROM.  Neck Supple.  Skin No rashes, lesions or ulceration. Normal palpated skin turgor. No nodularity.  Breasts: No masses or discharge.  Symmetric.  No axillary adenopathy.  Lungs: Clear to auscultation.No rales or wheezes. Normal Respiratory effort, no retractions.  Heart: NSR.  No murmurs or rubs appreciated. No periferal edema  Abdomen: Soft.  Non-tender.  No masses.  No HSM. No hernia  Extremities: Moves all appropriately.  Normal ROM for age. No lymphadenopathy.  Neuro: Oriented to  PPT.  Normal mood. Normal affect.     Pelvic: Deferred to OR   Findings:  The uterus is anteverted and measures 11.2 x 7.2 x 7.6 cm. Echo texture is heterogenous with evidence of focal masses. Within the uterus are multiple suspected fibroids measuring: Fibroid 1:  3.3 x 3.3 x 3.0 cm submucosal Fibroid 2:  2.4 x 2.0 x 2.1 cm Fibroid 3:   2.8 x 2.7 x 2.7 cm The  Endometrium measures 4 mm.   Right Ovary measures 9.2 x 5.3 x 8.0 cm. It is not normal in appearance; there are 2 large complex cysts:   Cyst 1:  5.5 x 4.8 x 6.2 cm  Cyst 2: 5.2 x 4.4 x 4.1 cm. ....both with solid components, suspicious for dermoid cyst, endometrioma or other neoplasm.   Left Ovary measures 2.4 x 2.9 x 3.5  cm. It is normal in appearance. Survey of the adnexa demonstrates no adnexal masses. There is no free fluid in the cul de sac.   Impression: 1. Fibroid uterus. 2. Abnormal Right Ovary - see above.      Assessment:   G0P0000 Patient Active Problem List   Diagnosis Date Noted   Iron deficiency anemia 02/25/2022    1. Pre-op exam   2. Ovarian mass   3. Elevated tumor markers     Likely benign right ovarian cyst.  Plan:   Orders: No orders of the defined types were placed in this encounter.    1.  Right oophorectomy  Pre-op discussions regarding Risks and Benefits of her scheduled surgery.  Pelvic mass The patient and I have reviewed the option of surgery and have discussed the differential diagnosis of a pelvic mass in someone her age.  We have discussed the possibility of oophorectomy, both unilateral and bilateral.  We have also discussed the possibility of accompanying abdominal hysterectomy, should this be deemed necessary at the time of surgery.  Although the chance of malignancy is small, this has also been discussed with the patient and the possibility of lymph node dissection or more extensive debulking surgery has been discussed in detail.  The possibility of another surgeon being called to perform other additional procedures, should they be necessary, has been discussed.  In addition to the above noted complications of a pelvic mass, we have also discussed the general complications found at any surgical procedure including bleeding, infection, damage to bowel, bladder, ureters or other internal organ, and the risk of anesthesia.  The possible  conversion of the case from laparoscopic to laparotomy was also discussed in detail.  The above risks were specifically discussed, but the patient has been informed her complications may not be limited to them.  I have answered all of the patient's questions and I believe she has an informed understanding of the surgery for pelvic mass.  oophorectomy The option of Oophorectomy has been discussed with the patient.  Detailed risk/benefits have been reviewed.  The risks discussed include, but are not limited to, hemorrhage, infection, damage to ureter or other internal organ, and Ovarian Remnant Syndrome.  The benefits include a significant decrease in the risk of Ovarian Cancer and in benign Ovarian disease.  The risk of Ovarian CA has been estimated at 1 in 23.  This is a relatively small risk.  However, should Ovarian CA develop, it is often found late in the course of the disease.  We have also discussed the role of inheritance in the development of Ovarian disease.  Some women, who have  close relatives with Ovarian CA, have a higher than 1 in 70 risk of Ovarian CA.  The benefits of Estrogen replacement therapy following Oophorectomy has been stressed.  If she is premenopausal, we have discussed the fact that this procedure will make her permanently sterile and that premature menopause will result if no ERT is begun.  I have answered all of her questions, and I believe that she has an adequate and informed understanding of the risks and benefits of Oophorectomy.  Patient would like to keep her left ovary and have only the right ovary removed if possible.  We have had a long discussion regarding management of uterine fibroids.  Patient would like to keep her uterus and plans to manage bleeding from uterine fibroids in another manner in the future.  She is absolutely certain of this she does not want a hysterectomy.  I spent 35 minutes involved in the care of this patient preparing to see the patient by  obtaining and reviewing her medical history (including labs, imaging tests and prior procedures), documenting clinical information in the electronic health record (EHR), counseling and coordinating care plans, writing and sending prescriptions, ordering tests or procedures and in direct communicating with the patient and medical staff discussing pertinent items from her history and physical exam.  Finis Bud, M.D. 08/29/2022 3:51 PM

## 2022-08-29 NOTE — H&P (View-Only) (Signed)
PRE-OPERATIVE HISTORY AND PHYSICAL EXAM  PCP:  Perrin Maltese, MD Subjective:   HPI:  Krista Holloway is a 41 y.o. G0P0000.  No LMP recorded.  She presents today for a pre-op discussion and PE.  She has the following symptoms: Complex right ovarian cyst.  Examined by both ultrasound and MRI.  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  Thought to be benign by Dr. Fransisca Connors (GYN oncology).  Patient has multiple uterine fibroids.  Review of Systems:   Constitutional: Denied constitutional symptoms, night sweats, recent illness, fatigue, fever, insomnia and weight loss.  Eyes: Denied eye symptoms, eye pain, photophobia, vision change and visual disturbance.  Ears/Nose/Throat/Neck: Denied ear, nose, throat or neck symptoms, hearing loss, nasal discharge, sinus congestion and sore throat.  Cardiovascular: Denied cardiovascular symptoms, arrhythmia, chest pain/pressure, edema, exercise intolerance, orthopnea and palpitations.  Respiratory: Denied pulmonary symptoms, asthma, pleuritic pain, productive sputum, cough, dyspnea and wheezing.  Gastrointestinal: Denied, gastro-esophageal reflux, melena, nausea and vomiting.  Genitourinary: See HPI for additional information.  Musculoskeletal: Denied musculoskeletal symptoms, stiffness, swelling, muscle weakness and myalgia.  Dermatologic: Denied dermatology symptoms, rash and scar.  Neurologic: Denied neurology symptoms, dizziness, headache, neck pain and syncope.  Psychiatric: Denied psychiatric symptoms, anxiety and depression.  Endocrine: Denied endocrine symptoms including hot flashes and night sweats.   OB History  Gravida Para Term Preterm AB Living  0 0 0 0 0 0  SAB IAB Ectopic Multiple Live Births  0 0 0 0 0    Past Medical History:  Diagnosis Date   Anemia     History reviewed. No pertinent surgical history.    SOCIAL HISTORY:  Social History   Tobacco Use  Smoking Status Former   Types: Cigarettes   Quit date: 2021   Years since  quitting: 2.8  Smokeless Tobacco Never   Social History   Substance and Sexual Activity  Alcohol Use Not Currently   Comment: socially    Social History   Substance and Sexual Activity  Drug Use Never    Family History  Problem Relation Age of Onset   Diabetes Mother    Glaucoma Mother    Hypertension Mother    Diabetes Father    Hypertension Father    Seizures Sister    Breast cancer Neg Hx     ALLERGIES:  Amoxicillin  MEDS:   Current Outpatient Medications on File Prior to Visit  Medication Sig Dispense Refill   Multiple Vitamin (MULTI-VITAMIN) tablet Take 1 tablet by mouth daily.     No current facility-administered medications on file prior to visit.    No orders of the defined types were placed in this encounter.    Physical examination BP (!) 142/87   Pulse 63   Ht '5\' 10"'$  (1.778 m)   Wt 209 lb 1.6 oz (94.8 kg)   BMI 30.00 kg/m   General NAD, Conversant  HEENT Atraumatic; Op clear with mmm.  Normo-cephalic. Pupils reactive. Anicteric sclerae  Thyroid/Neck Smooth without nodularity or enlargement. Normal ROM.  Neck Supple.  Skin No rashes, lesions or ulceration. Normal palpated skin turgor. No nodularity.  Breasts: No masses or discharge.  Symmetric.  No axillary adenopathy.  Lungs: Clear to auscultation.No rales or wheezes. Normal Respiratory effort, no retractions.  Heart: NSR.  No murmurs or rubs appreciated. No periferal edema  Abdomen: Soft.  Non-tender.  No masses.  No HSM. No hernia  Extremities: Moves all appropriately.  Normal ROM for age. No lymphadenopathy.  Neuro: Oriented to  PPT.  Normal mood. Normal affect.     Pelvic: Deferred to OR   Findings:  The uterus is anteverted and measures 11.2 x 7.2 x 7.6 cm. Echo texture is heterogenous with evidence of focal masses. Within the uterus are multiple suspected fibroids measuring: Fibroid 1:  3.3 x 3.3 x 3.0 cm submucosal Fibroid 2:  2.4 x 2.0 x 2.1 cm Fibroid 3:   2.8 x 2.7 x 2.7 cm The  Endometrium measures 4 mm.   Right Ovary measures 9.2 x 5.3 x 8.0 cm. It is not normal in appearance; there are 2 large complex cysts:   Cyst 1:  5.5 x 4.8 x 6.2 cm  Cyst 2: 5.2 x 4.4 x 4.1 cm. ....both with solid components, suspicious for dermoid cyst, endometrioma or other neoplasm.   Left Ovary measures 2.4 x 2.9 x 3.5  cm. It is normal in appearance. Survey of the adnexa demonstrates no adnexal masses. There is no free fluid in the cul de sac.   Impression: 1. Fibroid uterus. 2. Abnormal Right Ovary - see above.      Assessment:   G0P0000 Patient Active Problem List   Diagnosis Date Noted   Iron deficiency anemia 02/25/2022    1. Pre-op exam   2. Ovarian mass   3. Elevated tumor markers     Likely benign right ovarian cyst.  Plan:   Orders: No orders of the defined types were placed in this encounter.    1.  Right oophorectomy via laparoscopy

## 2022-08-29 NOTE — Progress Notes (Signed)
Patient presents today for a pre-op exam prior to oophorectomy. She states concerns about potential blood transfusion. She states no additional concerns today.

## 2022-08-29 NOTE — H&P (Signed)
PRE-OPERATIVE HISTORY AND PHYSICAL EXAM  PCP:  Perrin Maltese, MD Subjective:   HPI:  Krista Holloway is a 41 y.o. G0P0000.  No LMP recorded.  She presents today for a pre-op discussion and PE.  She has the following symptoms: Complex right ovarian cyst.  Examined by both ultrasound and MRI.  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  Thought to be benign by Dr. Fransisca Connors (GYN oncology).  Patient has multiple uterine fibroids.  Review of Systems:   Constitutional: Denied constitutional symptoms, night sweats, recent illness, fatigue, fever, insomnia and weight loss.  Eyes: Denied eye symptoms, eye pain, photophobia, vision change and visual disturbance.  Ears/Nose/Throat/Neck: Denied ear, nose, throat or neck symptoms, hearing loss, nasal discharge, sinus congestion and sore throat.  Cardiovascular: Denied cardiovascular symptoms, arrhythmia, chest pain/pressure, edema, exercise intolerance, orthopnea and palpitations.  Respiratory: Denied pulmonary symptoms, asthma, pleuritic pain, productive sputum, cough, dyspnea and wheezing.  Gastrointestinal: Denied, gastro-esophageal reflux, melena, nausea and vomiting.  Genitourinary: See HPI for additional information.  Musculoskeletal: Denied musculoskeletal symptoms, stiffness, swelling, muscle weakness and myalgia.  Dermatologic: Denied dermatology symptoms, rash and scar.  Neurologic: Denied neurology symptoms, dizziness, headache, neck pain and syncope.  Psychiatric: Denied psychiatric symptoms, anxiety and depression.  Endocrine: Denied endocrine symptoms including hot flashes and night sweats.   OB History  Gravida Para Term Preterm AB Living  0 0 0 0 0 0  SAB IAB Ectopic Multiple Live Births  0 0 0 0 0    Past Medical History:  Diagnosis Date   Anemia     History reviewed. No pertinent surgical history.    SOCIAL HISTORY:  Social History   Tobacco Use  Smoking Status Former   Types: Cigarettes   Quit date: 2021   Years since  quitting: 2.8  Smokeless Tobacco Never   Social History   Substance and Sexual Activity  Alcohol Use Not Currently   Comment: socially    Social History   Substance and Sexual Activity  Drug Use Never    Family History  Problem Relation Age of Onset   Diabetes Mother    Glaucoma Mother    Hypertension Mother    Diabetes Father    Hypertension Father    Seizures Sister    Breast cancer Neg Hx     ALLERGIES:  Amoxicillin  MEDS:   Current Outpatient Medications on File Prior to Visit  Medication Sig Dispense Refill   Multiple Vitamin (MULTI-VITAMIN) tablet Take 1 tablet by mouth daily.     No current facility-administered medications on file prior to visit.    No orders of the defined types were placed in this encounter.    Physical examination BP (!) 142/87   Pulse 63   Ht '5\' 10"'$  (1.778 m)   Wt 209 lb 1.6 oz (94.8 kg)   BMI 30.00 kg/m   General NAD, Conversant  HEENT Atraumatic; Op clear with mmm.  Normo-cephalic. Pupils reactive. Anicteric sclerae  Thyroid/Neck Smooth without nodularity or enlargement. Normal ROM.  Neck Supple.  Skin No rashes, lesions or ulceration. Normal palpated skin turgor. No nodularity.  Breasts: No masses or discharge.  Symmetric.  No axillary adenopathy.  Lungs: Clear to auscultation.No rales or wheezes. Normal Respiratory effort, no retractions.  Heart: NSR.  No murmurs or rubs appreciated. No periferal edema  Abdomen: Soft.  Non-tender.  No masses.  No HSM. No hernia  Extremities: Moves all appropriately.  Normal ROM for age. No lymphadenopathy.  Neuro: Oriented to  PPT.  Normal mood. Normal affect.     Pelvic: Deferred to OR   Findings:  The uterus is anteverted and measures 11.2 x 7.2 x 7.6 cm. Echo texture is heterogenous with evidence of focal masses. Within the uterus are multiple suspected fibroids measuring: Fibroid 1:  3.3 x 3.3 x 3.0 cm submucosal Fibroid 2:  2.4 x 2.0 x 2.1 cm Fibroid 3:   2.8 x 2.7 x 2.7 cm The  Endometrium measures 4 mm.   Right Ovary measures 9.2 x 5.3 x 8.0 cm. It is not normal in appearance; there are 2 large complex cysts:   Cyst 1:  5.5 x 4.8 x 6.2 cm  Cyst 2: 5.2 x 4.4 x 4.1 cm. ....both with solid components, suspicious for dermoid cyst, endometrioma or other neoplasm.   Left Ovary measures 2.4 x 2.9 x 3.5  cm. It is normal in appearance. Survey of the adnexa demonstrates no adnexal masses. There is no free fluid in the cul de sac.   Impression: 1. Fibroid uterus. 2. Abnormal Right Ovary - see above.      Assessment:   G0P0000 Patient Active Problem List   Diagnosis Date Noted   Iron deficiency anemia 02/25/2022    1. Pre-op exam   2. Ovarian mass   3. Elevated tumor markers     Likely benign right ovarian cyst.  Plan:   Orders: No orders of the defined types were placed in this encounter.    1.  Right oophorectomy via laparoscopy

## 2022-09-11 ENCOUNTER — Other Ambulatory Visit: Payer: Self-pay | Admitting: *Deleted

## 2022-09-11 DIAGNOSIS — D509 Iron deficiency anemia, unspecified: Secondary | ICD-10-CM

## 2022-09-12 ENCOUNTER — Inpatient Hospital Stay: Payer: 59 | Attending: Oncology

## 2022-09-12 DIAGNOSIS — N920 Excessive and frequent menstruation with regular cycle: Secondary | ICD-10-CM | POA: Insufficient documentation

## 2022-09-12 DIAGNOSIS — D509 Iron deficiency anemia, unspecified: Secondary | ICD-10-CM | POA: Diagnosis not present

## 2022-09-12 LAB — SAMPLE TO BLOOD BANK

## 2022-09-12 LAB — COMPREHENSIVE METABOLIC PANEL
ALT: 11 U/L (ref 0–44)
AST: 16 U/L (ref 15–41)
Albumin: 4.5 g/dL (ref 3.5–5.0)
Alkaline Phosphatase: 53 U/L (ref 38–126)
Anion gap: 9 (ref 5–15)
BUN: 10 mg/dL (ref 6–20)
CO2: 23 mmol/L (ref 22–32)
Calcium: 9.4 mg/dL (ref 8.9–10.3)
Chloride: 104 mmol/L (ref 98–111)
Creatinine, Ser: 0.74 mg/dL (ref 0.44–1.00)
GFR, Estimated: >60 mL/min (ref >60)
Glucose, Bld: 88 mg/dL (ref 70–99)
Potassium: 3.7 mmol/L (ref 3.5–5.1)
Sodium: 136 mmol/L (ref 135–145)
Total Bilirubin: 0.4 mg/dL (ref 0.3–1.2)
Total Protein: 9 g/dL — ABNORMAL HIGH (ref 6.5–8.1)

## 2022-09-12 LAB — CBC WITH DIFFERENTIAL/PLATELET
Abs Immature Granulocytes: 0.01 10*3/uL (ref 0.00–0.07)
Basophils Absolute: 0.1 10*3/uL (ref 0.0–0.1)
Basophils Relative: 1 %
Eosinophils Absolute: 0.1 10*3/uL (ref 0.0–0.5)
Eosinophils Relative: 1 %
HCT: 41 % (ref 36.0–46.0)
Hemoglobin: 13.1 g/dL (ref 12.0–15.0)
Immature Granulocytes: 0 %
Lymphocytes Relative: 42 %
Lymphs Abs: 2.6 10*3/uL (ref 0.7–4.0)
MCH: 26.1 pg (ref 26.0–34.0)
MCHC: 32 g/dL (ref 30.0–36.0)
MCV: 81.8 fL (ref 80.0–100.0)
Monocytes Absolute: 0.5 10*3/uL (ref 0.1–1.0)
Monocytes Relative: 8 %
Neutro Abs: 3.1 10*3/uL (ref 1.7–7.7)
Neutrophils Relative %: 48 %
Platelets: 380 10*3/uL (ref 150–400)
RBC: 5.01 MIL/uL (ref 3.87–5.11)
RDW: 17.5 % — ABNORMAL HIGH (ref 11.5–15.5)
WBC: 6.3 10*3/uL (ref 4.0–10.5)
nRBC: 0 % (ref 0.0–0.2)

## 2022-09-12 LAB — IRON AND TIBC
Iron: 41 ug/dL (ref 28–170)
Saturation Ratios: 8 % — ABNORMAL LOW (ref 10.4–31.8)
TIBC: 487 ug/dL — ABNORMAL HIGH (ref 250–450)
UIBC: 446 ug/dL

## 2022-09-12 LAB — FERRITIN: Ferritin: 9 ng/mL — ABNORMAL LOW (ref 11–307)

## 2022-09-13 ENCOUNTER — Encounter: Payer: Self-pay | Admitting: Oncology

## 2022-09-13 ENCOUNTER — Inpatient Hospital Stay: Payer: 59

## 2022-09-13 ENCOUNTER — Inpatient Hospital Stay (HOSPITAL_BASED_OUTPATIENT_CLINIC_OR_DEPARTMENT_OTHER): Payer: 59 | Admitting: Oncology

## 2022-09-13 VITALS — BP 119/75 | HR 72 | Resp 16

## 2022-09-13 VITALS — BP 115/80 | HR 75 | Temp 98.2°F | Resp 16 | Ht 70.0 in | Wt 212.0 lb

## 2022-09-13 DIAGNOSIS — N920 Excessive and frequent menstruation with regular cycle: Secondary | ICD-10-CM | POA: Diagnosis not present

## 2022-09-13 DIAGNOSIS — D509 Iron deficiency anemia, unspecified: Secondary | ICD-10-CM

## 2022-09-13 MED ORDER — SODIUM CHLORIDE 0.9 % IV SOLN
200.0000 mg | Freq: Once | INTRAVENOUS | Status: AC
  Administered 2022-09-13: 200 mg via INTRAVENOUS
  Filled 2022-09-13: qty 200

## 2022-09-13 MED ORDER — SODIUM CHLORIDE 0.9 % IV SOLN
Freq: Once | INTRAVENOUS | Status: AC
  Filled 2022-09-13: qty 250

## 2022-09-13 NOTE — Progress Notes (Signed)
[name]  Telephone:([phone]) [phone] Fax:(336) [phone]  ID: Krista Holloway OB: Mar 07, 1981  MR#: [id number]  BJS#:[id number]  Patient Care Team: Perrin Maltese, MD as PCP - General (Internal Medicine)  CHIEF COMPLAINT: Iron deficiency anemia.   INTERVAL HISTORY: Patient returns to clinic today for repeat laboratory work, further evaluation, and consideration of additional IV Venofer.  She continues to feel well and remains asymptomatic.  She denies any further weakness or fatigue. She has no neurologic complaints.  She denies any recent fevers or illnesses.  She has a good appetite and denies weight loss.  She has no chest pain, shortness of breath, cough, or hemoptysis.  She denies any nausea, vomiting, constipation, or diarrhea.  She has no melena or hematochezia.  She has no urinary complaints.  Patient offers no specific complaints today.  REVIEW OF SYSTEMS:   Review of Systems  Constitutional: Negative.  Negative for fever, malaise/fatigue and weight loss.  Respiratory: Negative.  Negative for cough, hemoptysis and shortness of breath.   Cardiovascular: Negative.  Negative for chest pain and leg swelling.  Gastrointestinal: Negative.  Negative for abdominal pain, blood in stool and melena.  Genitourinary: Negative.  Negative for hematuria.  Musculoskeletal: Negative.  Negative for back pain.  Skin: Negative.  Negative for rash.  Neurological: Negative.  Negative for dizziness, focal weakness and weakness.  Psychiatric/Behavioral: Negative.  The patient is not nervous/anxious.     As per HPI. Otherwise, a complete review of systems is negative.  PAST MEDICAL HISTORY: Past Medical History:  Diagnosis Date   Anemia     PAST SURGICAL HISTORY: History reviewed. No pertinent surgical history.  FAMILY HISTORY: Family History  Problem Relation Age of Onset   Diabetes Mother    Glaucoma Mother    Hypertension Mother    Diabetes Father    Hypertension Father     Seizures Sister    Breast cancer Neg Hx     ADVANCED DIRECTIVES (Y/N):  N  HEALTH MAINTENANCE: Social History   Tobacco Use   Smoking status: Former    Types: Cigarettes    Quit date: 2021    Years since quitting: 2.9   Smokeless tobacco: Never  Vaping Use   Vaping Use: Every day   Substances: Nicotine  Substance Use Topics   Alcohol use: Not Currently    Comment: socially   Drug use: Never     Colonoscopy:  PAP:  Bone density:  Lipid panel:  Allergies  Allergen Reactions   Amoxicillin     Other reaction(s): Unknown Other reaction(s): Unknown     Current Outpatient Medications  Medication Sig Dispense Refill   Multiple Vitamin (MULTI-VITAMIN) tablet Take 1 tablet by mouth daily. (Patient not taking: Reported on 09/13/2022)     No current facility-administered medications for this visit.    OBJECTIVE: Vitals:   09/13/22 1439  BP: 115/80  Pulse: 75  Resp: 16  Temp: 98.2 F (36.8 C)  SpO2: 100%     Body mass index is 30.42 kg/m.    ECOG FS:0 - Asymptomatic  General: Well-developed, well-nourished, no acute distress. Eyes: Pink conjunctiva, anicteric sclera. HEENT: Normocephalic, moist mucous membranes. Lungs: No audible wheezing or coughing. Heart: Regular rate and rhythm. Abdomen: Soft, nontender, no obvious distention. Musculoskeletal: No edema, cyanosis, or clubbing. Neuro: Alert, answering all questions appropriately. Cranial nerves grossly intact. Skin: No rashes or petechiae noted. Psych: Normal affect.  LAB RESULTS:  Lab Results  Component Value Date   NA 136 09/12/2022  K 3.7 09/12/2022   CL 104 09/12/2022   CO2 23 09/12/2022   GLUCOSE 88 09/12/2022   BUN 10 09/12/2022   CREATININE 0.74 09/12/2022   CALCIUM 9.4 09/12/2022   PROT 9.0 (H) 09/12/2022   ALBUMIN 4.5 09/12/2022   AST 16 09/12/2022   ALT 11 09/12/2022   ALKPHOS 53 09/12/2022   BILITOT 0.4 09/12/2022   GFRNONAA >60 09/12/2022    Lab Results  Component Value  Date   WBC 6.3 09/12/2022   NEUTROABS 3.1 09/12/2022   HGB 13.1 09/12/2022   HCT 41.0 09/12/2022   MCV 81.8 09/12/2022   PLT 380 09/12/2022   Lab Results  Component Value Date   IRON 41 09/12/2022   TIBC 487 (H) 09/12/2022   IRONPCTSAT 8 (L) 09/12/2022   Lab Results  Component Value Date   FERRITIN 9 (L) 09/12/2022     STUDIES: No results found.  ASSESSMENT:  Iron deficiency anemia.   PLAN:    Iron deficiency anemia: Likely secondary to heavy menses.  Patient's hemoglobin has now normalized and is 13.2, but her iron stores remain significantly reduced.  Proceed with 200 mg IV Venofer today.  Patient will then return to clinic 3 times next week to receive 3 additional doses of Venofer.  Return to clinic in 4 months for repeat laboratory work, further evaluation, and continuation of treatment if needed.     Leukopenia: Resolved. Thrombocytosis: Resolved. Heavy menses: MRI results reviewed independently.  Patient also noted to have a large right adnexal cystic mass.  She reports that she is undergoing surgery on September 28, 2022.  IV iron as above.    I spent a total of 30 minutes reviewing chart data, face-to-face evaluation with the patient, counseling and coordination of care as detailed above.   Patient expressed understanding and was in agreement with this plan. She also understands that She can call clinic at any time with any questions, concerns, or complaints.    Lloyd Huger, MD   09/14/2022 11:55 AM

## 2022-09-13 NOTE — Progress Notes (Signed)
Pt stayed for 20 minutes post infusion monitoring. Unable to stay the full 30 minutes. Stable and ambulatory at time of discharge.

## 2022-09-14 ENCOUNTER — Encounter: Payer: Self-pay | Admitting: Oncology

## 2022-09-19 ENCOUNTER — Inpatient Hospital Stay: Payer: 59 | Attending: Oncology

## 2022-09-19 VITALS — BP 139/92 | HR 67 | Resp 18

## 2022-09-19 DIAGNOSIS — D509 Iron deficiency anemia, unspecified: Secondary | ICD-10-CM | POA: Diagnosis not present

## 2022-09-19 DIAGNOSIS — N920 Excessive and frequent menstruation with regular cycle: Secondary | ICD-10-CM | POA: Insufficient documentation

## 2022-09-19 MED ORDER — SODIUM CHLORIDE 0.9 % IV SOLN
INTRAVENOUS | Status: DC
  Filled 2022-09-19: qty 250

## 2022-09-19 MED ORDER — SODIUM CHLORIDE 0.9 % IV SOLN
200.0000 mg | Freq: Once | INTRAVENOUS | Status: AC
  Administered 2022-09-19: 200 mg via INTRAVENOUS
  Filled 2022-09-19: qty 200

## 2022-09-19 NOTE — Patient Instructions (Signed)

## 2022-09-20 ENCOUNTER — Encounter
Admission: RE | Admit: 2022-09-20 | Discharge: 2022-09-20 | Disposition: A | Discharge status: Home / Self Care | Payer: 59 | Source: Ambulatory Visit | Attending: Obstetrics and Gynecology | Admitting: Obstetrics and Gynecology

## 2022-09-20 VITALS — Ht 70.0 in | Wt 209.0 lb

## 2022-09-20 DIAGNOSIS — Z01812 Encounter for preprocedural laboratory examination: Secondary | ICD-10-CM

## 2022-09-20 HISTORY — DX: Unspecified ovarian cyst, right side: N83.201

## 2022-09-20 HISTORY — DX: Leiomyoma of uterus, unspecified: D25.9

## 2022-09-20 HISTORY — DX: Iron deficiency anemia, unspecified: D50.9

## 2022-09-20 MED FILL — Iron Sucrose Inj 20 MG/ML (Fe Equiv): INTRAVENOUS | Qty: 10 | Status: AC

## 2022-09-20 NOTE — Patient Instructions (Addendum)
Your procedure is scheduled on: Friday, December 15 Report to the Registration Desk on the 1st floor of the [facility]. To find out your arrival time, please call [phone] between 1PM - 3PM on: Thursday, December 14 If your arrival time is 6:00 am, do not arrive prior to that time as the [facility] entrance doors do not open until 6:00 am.  REMEMBER: Instructions that are not followed completely may result in serious medical risk, up to and including death; or upon the discretion of your surgeon and anesthesiologist your surgery may need to be rescheduled.  Do not eat or drink after midnight the night before surgery.  No gum chewing, lozengers or hard candies.  DO NOT TAKE ANY MEDICATIONS THE MORNING OF SURGERY   One week prior to surgery: starting December 8 Stop Anti-inflammatories (NSAIDS) such as Advil, Aleve, Ibuprofen, Motrin, Naproxen, Naprosyn and Aspirin based products such as Excedrin, Goodys Powder, BC Powder. Stop ANY OVER THE COUNTER supplements until after surgery. You may however, continue to take Tylenol if needed for pain up until the day of surgery.  No Alcohol for 24 hours before or after surgery.  No Smoking including e-cigarettes for 24 hours prior to surgery.  No chewable tobacco products for at least 6 hours prior to surgery.  No nicotine patches on the day of surgery.  Do not use any "recreational" drugs for at least a week prior to your surgery.  Please be advised that the combination of cocaine and anesthesia may have negative outcomes, up to and including death. If you test positive for cocaine, your surgery will be cancelled.  On the morning of surgery brush your teeth with toothpaste and water, you may rinse your mouth with mouthwash if you wish. Do not swallow any toothpaste or mouthwash.  Use CHG Soap as directed on instruction sheet.  Do not wear jewelry, make-up, hairpins, clips or nail polish.  Do not wear lotions, powders, or perfumes.    Do not shave body from the neck down 48 hours prior to surgery just in case you cut yourself which could leave a site for infection.  Also, freshly shaved skin may become irritated if using the CHG soap.  Contact lenses, hearing aids and dentures may not be worn into surgery.  Do not bring valuables to the hospital. [facility] is not responsible for any missing/lost belongings or valuables.   Notify your doctor if there is any change in your medical condition (cold, fever, infection).  Wear comfortable clothing (specific to your surgery type) to the hospital.  After surgery, you can help prevent lung complications by doing breathing exercises.  Take deep breaths and cough every 1-2 hours. Your doctor may order a device called an Incentive Spirometer to help you take deep breaths. When coughing or sneezing, hold a pillow firmly against your incision with both hands. This is called "splinting." Doing this helps protect your incision. It also decreases belly discomfort.  If you are being discharged the day of surgery, you will not be allowed to drive home. You will need a responsible adult (18 years or older) to drive you home and stay with you that night.   If you are taking public transportation, you will need to have a responsible adult (18 years or older) with you. Please confirm with your physician that it is acceptable to use public transportation.   Please call the [facility] at [phone] if you have any questions about these instructions.  Surgery  Visitation Policy:  Patients undergoing a surgery or procedure may have two family members or support persons with them as long as the person is not COVID-19 positive or experiencing its symptoms.      Preparing for Surgery with CHLORHEXIDINE GLUCONATE (CHG) Soap  Chlorhexidine Gluconate (CHG) Soap  o An antiseptic cleaner that kills germs and bonds with the skin to continue killing germs even after  washing  o Used for showering the night before surgery and morning of surgery  Before surgery, you can play an important role by reducing the number of germs on your skin.  CHG (Chlorhexidine gluconate) soap is an antiseptic cleanser which kills germs and bonds with the skin to continue killing germs even after washing.  Please do not use if you have an allergy to CHG or antibacterial soaps. If your skin becomes reddened/irritated stop using the CHG.  1. Shower the NIGHT BEFORE SURGERY and the MORNING OF SURGERY with CHG soap.  2. If you choose to wash your hair, wash your hair first as usual with your normal shampoo.  3. After shampooing, rinse your hair and body thoroughly to remove the shampoo.  4. Use CHG as you would any other liquid soap. You can apply CHG directly to the skin and wash gently with a scrungie or a clean washcloth.  5. Apply the CHG soap to your body only from the neck down. Do not use on open wounds or open sores. Avoid contact with your eyes, ears, mouth, and genitals (private parts). Wash face and genitals (private parts) with your normal soap.  6. Wash thoroughly, paying special attention to the area where your surgery will be performed.  7. Thoroughly rinse your body with warm water.  8. Do not shower/wash with your normal soap after using and rinsing off the CHG soap.  9. Pat yourself dry with a clean towel.  10. Wear clean pajamas to bed the night before surgery.  12. Place clean sheets on your bed the night of your first shower and do not sleep with pets.  13. Shower again with the CHG soap on the day of surgery prior to arriving at the hospital.  14. Do not apply any deodorants/lotions/powders.  15. Please wear clean clothes to the hospital.

## 2022-09-21 ENCOUNTER — Inpatient Hospital Stay: Payer: 59

## 2022-09-21 ENCOUNTER — Encounter
Admission: RE | Admit: 2022-09-21 | Discharge: 2022-09-21 | Disposition: A | Discharge status: Home / Self Care | Payer: 59 | Source: Ambulatory Visit | Attending: Obstetrics and Gynecology | Admitting: Obstetrics and Gynecology

## 2022-09-21 VITALS — BP 127/82 | HR 67 | Temp 97.0°F | Resp 18

## 2022-09-21 DIAGNOSIS — D509 Iron deficiency anemia, unspecified: Secondary | ICD-10-CM

## 2022-09-21 DIAGNOSIS — Z01812 Encounter for preprocedural laboratory examination: Secondary | ICD-10-CM | POA: Diagnosis not present

## 2022-09-21 DIAGNOSIS — N920 Excessive and frequent menstruation with regular cycle: Secondary | ICD-10-CM | POA: Diagnosis not present

## 2022-09-21 DIAGNOSIS — Z01818 Encounter for other preprocedural examination: Secondary | ICD-10-CM

## 2022-09-21 MED ORDER — SODIUM CHLORIDE 0.9 % IV SOLN
Freq: Once | INTRAVENOUS | Status: AC
  Filled 2022-09-21: qty 250

## 2022-09-21 MED ORDER — SODIUM CHLORIDE 0.9 % IV SOLN
200.0000 mg | Freq: Once | INTRAVENOUS | Status: AC
  Administered 2022-09-21: 200 mg via INTRAVENOUS
  Filled 2022-09-21: qty 200

## 2022-09-28 ENCOUNTER — Encounter: Payer: Self-pay | Admitting: Obstetrics and Gynecology

## 2022-09-28 ENCOUNTER — Ambulatory Visit: Payer: 59 | Admitting: Anesthesiology

## 2022-09-28 ENCOUNTER — Ambulatory Visit
Admission: RE | Admit: 2022-09-28 | Discharge: 2022-09-28 | Disposition: A | Discharge status: Home / Self Care | Payer: 59 | Source: Ambulatory Visit | Attending: Obstetrics and Gynecology | Admitting: Obstetrics and Gynecology

## 2022-09-28 ENCOUNTER — Other Ambulatory Visit: Payer: Self-pay

## 2022-09-28 ENCOUNTER — Encounter
Admission: RE | Disposition: A | Discharge status: Home / Self Care | Payer: Self-pay | Source: Ambulatory Visit | Attending: Obstetrics and Gynecology

## 2022-09-28 ENCOUNTER — Other Ambulatory Visit: Payer: Self-pay | Admitting: Obstetrics and Gynecology

## 2022-09-28 DIAGNOSIS — D509 Iron deficiency anemia, unspecified: Secondary | ICD-10-CM | POA: Diagnosis not present

## 2022-09-28 DIAGNOSIS — D270 Benign neoplasm of right ovary: Secondary | ICD-10-CM | POA: Diagnosis not present

## 2022-09-28 DIAGNOSIS — Z87891 Personal history of nicotine dependence: Secondary | ICD-10-CM | POA: Insufficient documentation

## 2022-09-28 DIAGNOSIS — N83201 Unspecified ovarian cyst, right side: Secondary | ICD-10-CM | POA: Diagnosis not present

## 2022-09-28 DIAGNOSIS — N8311 Corpus luteum cyst of right ovary: Secondary | ICD-10-CM | POA: Diagnosis not present

## 2022-09-28 DIAGNOSIS — N839 Noninflammatory disorder of ovary, fallopian tube and broad ligament, unspecified: Secondary | ICD-10-CM | POA: Diagnosis not present

## 2022-09-28 DIAGNOSIS — N838 Other noninflammatory disorders of ovary, fallopian tube and broad ligament: Secondary | ICD-10-CM | POA: Diagnosis not present

## 2022-09-28 DIAGNOSIS — Z01812 Encounter for preprocedural laboratory examination: Secondary | ICD-10-CM

## 2022-09-28 DIAGNOSIS — D259 Leiomyoma of uterus, unspecified: Secondary | ICD-10-CM | POA: Insufficient documentation

## 2022-09-28 LAB — TYPE AND SCREEN
ABO/RH(D): A POS
Antibody Screen: POSITIVE

## 2022-09-28 LAB — POCT PREGNANCY, URINE: Preg Test, Ur: NEGATIVE

## 2022-09-28 SURGERY — OOPHORECTOMY, LAPAROSCOPIC
Anesthesia: General | Site: Pelvis | Laterality: Right

## 2022-09-28 MED ORDER — 0.9 % SODIUM CHLORIDE (POUR BTL) OPTIME
TOPICAL | Status: DC | PRN
  Administered 2022-09-28: 500 mL

## 2022-09-28 MED ORDER — MIDAZOLAM HCL 2 MG/2ML IJ SOLN
INTRAMUSCULAR | Status: AC
  Filled 2022-09-28: qty 2

## 2022-09-28 MED ORDER — PROMETHAZINE HCL 25 MG/ML IJ SOLN: 6.2500 mg | INTRAMUSCULAR | Status: DC | PRN

## 2022-09-28 MED ORDER — SODIUM CHLORIDE 0.9 % IV SOLN: INTRAVENOUS | Status: DC

## 2022-09-28 MED ORDER — FAMOTIDINE 20 MG PO TABS
20.0000 mg | ORAL_TABLET | Freq: Once | ORAL | Status: AC
  Administered 2022-09-28: 20 mg via ORAL

## 2022-09-28 MED ORDER — DEXMEDETOMIDINE HCL IN NACL 80 MCG/20ML IV SOLN
INTRAVENOUS | Status: DC | PRN
  Administered 2022-09-28: 10 ug via BUCCAL

## 2022-09-28 MED ORDER — PHENYLEPHRINE HCL (PRESSORS) 10 MG/ML IV SOLN
INTRAVENOUS | Status: DC | PRN
  Administered 2022-09-28: 40 ug via INTRAVENOUS
  Administered 2022-09-28: 80 ug via INTRAVENOUS

## 2022-09-28 MED ORDER — LIDOCAINE HCL (PF) 2 % IJ SOLN
INTRAMUSCULAR | Status: AC
  Filled 2022-09-28: qty 5

## 2022-09-28 MED ORDER — SUGAMMADEX SODIUM 200 MG/2ML IV SOLN
INTRAVENOUS | Status: DC | PRN
  Administered 2022-09-28: 200 mg via INTRAVENOUS

## 2022-09-28 MED ORDER — FENTANYL CITRATE (PF) 100 MCG/2ML IJ SOLN: 25.0000 ug | INTRAMUSCULAR | Status: DC | PRN

## 2022-09-28 MED ORDER — LACTATED RINGERS IV SOLN: INTRAVENOUS | Status: DC

## 2022-09-28 MED ORDER — ONDANSETRON HCL 4 MG/2ML IJ SOLN
INTRAMUSCULAR | Status: DC | PRN
  Administered 2022-09-28: 4 mg via INTRAVENOUS

## 2022-09-28 MED ORDER — FENTANYL CITRATE (PF) 100 MCG/2ML IJ SOLN
INTRAMUSCULAR | Status: DC | PRN
  Administered 2022-09-28 (×2): 50 ug via INTRAVENOUS

## 2022-09-28 MED ORDER — OXYCODONE HCL 5 MG PO TABS: 5.0000 mg | ORAL_TABLET | Freq: Once | ORAL | Status: DC | PRN

## 2022-09-28 MED ORDER — LIDOCAINE HCL (CARDIAC) PF 100 MG/5ML IV SOSY
PREFILLED_SYRINGE | INTRAVENOUS | Status: DC | PRN
  Administered 2022-09-28: 100 mg via INTRAVENOUS

## 2022-09-28 MED ORDER — KETAMINE HCL 50 MG/5ML IJ SOSY
PREFILLED_SYRINGE | INTRAMUSCULAR | Status: AC
  Filled 2022-09-28: qty 5

## 2022-09-28 MED ORDER — ROCURONIUM BROMIDE 10 MG/ML (PF) SYRINGE
PREFILLED_SYRINGE | INTRAVENOUS | Status: AC
  Filled 2022-09-28: qty 10

## 2022-09-28 MED ORDER — DEXAMETHASONE SODIUM PHOSPHATE 10 MG/ML IJ SOLN
INTRAMUSCULAR | Status: DC | PRN
  Administered 2022-09-28: 10 mg via INTRAVENOUS

## 2022-09-28 MED ORDER — ORAL CARE MOUTH RINSE: 15.0000 mL | Freq: Once | OROMUCOSAL | Status: AC

## 2022-09-28 MED ORDER — IBUPROFEN 600 MG PO TABS: 600.0000 mg | ORAL_TABLET | Freq: Four times a day (QID) | ORAL | 0 refills | Status: AC | PRN

## 2022-09-28 MED ORDER — ROCURONIUM BROMIDE 100 MG/10ML IV SOLN
INTRAVENOUS | Status: DC | PRN
  Administered 2022-09-28 (×2): 10 mg via INTRAVENOUS
  Administered 2022-09-28: 50 mg via INTRAVENOUS

## 2022-09-28 MED ORDER — DEXMEDETOMIDINE HCL IN NACL 80 MCG/20ML IV SOLN
INTRAVENOUS | Status: AC
  Filled 2022-09-28: qty 20

## 2022-09-28 MED ORDER — LACTATED RINGERS IR SOLN
Status: DC | PRN
  Administered 2022-09-28: 300

## 2022-09-28 MED ORDER — HYDROCODONE-ACETAMINOPHEN 5-325 MG PO TABS: 1.0000 | ORAL_TABLET | Freq: Four times a day (QID) | ORAL | 0 refills | Status: DC | PRN

## 2022-09-28 MED ORDER — BUPIVACAINE HCL 0.5 % IJ SOLN
INTRAMUSCULAR | Status: DC | PRN
  Administered 2022-09-28: 18 mL

## 2022-09-28 MED ORDER — OXYCODONE-ACETAMINOPHEN 5-325 MG PO TABS: 1.0000 | ORAL_TABLET | Freq: Four times a day (QID) | ORAL | 0 refills | Status: DC | PRN

## 2022-09-28 MED ORDER — FENTANYL CITRATE (PF) 100 MCG/2ML IJ SOLN
INTRAMUSCULAR | Status: AC
  Filled 2022-09-28: qty 2

## 2022-09-28 MED ORDER — BUPIVACAINE HCL (PF) 0.5 % IJ SOLN
INTRAMUSCULAR | Status: AC
  Filled 2022-09-28: qty 30

## 2022-09-28 MED ORDER — CHLORHEXIDINE GLUCONATE 0.12 % MT SOLN
OROMUCOSAL | Status: AC
  Filled 2022-09-28: qty 15

## 2022-09-28 MED ORDER — CHLORHEXIDINE GLUCONATE 0.12 % MT SOLN
15.0000 mL | Freq: Once | OROMUCOSAL | Status: AC
  Administered 2022-09-28: 15 mL via OROMUCOSAL

## 2022-09-28 MED ORDER — OXYCODONE HCL 5 MG/5ML PO SOLN: 5.0000 mg | Freq: Once | ORAL | Status: DC | PRN

## 2022-09-28 MED ORDER — EPHEDRINE 5 MG/ML INJ
INTRAVENOUS | Status: AC
  Filled 2022-09-28: qty 5

## 2022-09-28 MED ORDER — VASOPRESSIN 20 UNIT/ML IV SOLN
INTRAVENOUS | Status: AC
  Filled 2022-09-28: qty 1

## 2022-09-28 MED ORDER — PROPOFOL 10 MG/ML IV BOLUS
INTRAVENOUS | Status: DC | PRN
  Administered 2022-09-28: 150 mg via INTRAVENOUS

## 2022-09-28 MED ORDER — SODIUM CHLORIDE (PF) 0.9 % IJ SOLN
INTRAMUSCULAR | Status: AC
  Filled 2022-09-28: qty 50

## 2022-09-28 MED ORDER — ONDANSETRON HCL 4 MG/2ML IJ SOLN
INTRAMUSCULAR | Status: AC
  Filled 2022-09-28: qty 2

## 2022-09-28 MED ORDER — POVIDONE-IODINE 10 % EX SWAB
2.0000 | Freq: Once | CUTANEOUS | Status: AC
  Administered 2022-09-28: 2 via TOPICAL

## 2022-09-28 MED ORDER — KETOROLAC TROMETHAMINE 30 MG/ML IJ SOLN
INTRAMUSCULAR | Status: AC
  Filled 2022-09-28: qty 1

## 2022-09-28 MED ORDER — PHENYLEPHRINE 80 MCG/ML (10ML) SYRINGE FOR IV PUSH (FOR BLOOD PRESSURE SUPPORT)
PREFILLED_SYRINGE | INTRAVENOUS | Status: AC
  Filled 2022-09-28: qty 10

## 2022-09-28 MED ORDER — PROPOFOL 10 MG/ML IV BOLUS
INTRAVENOUS | Status: AC
  Filled 2022-09-28: qty 20

## 2022-09-28 MED ORDER — ACETAMINOPHEN 10 MG/ML IV SOLN
INTRAVENOUS | Status: DC | PRN
  Administered 2022-09-28: 1000 mg via INTRAVENOUS

## 2022-09-28 MED ORDER — DROPERIDOL 2.5 MG/ML IJ SOLN: 0.6250 mg | Freq: Once | INTRAMUSCULAR | Status: DC | PRN

## 2022-09-28 MED ORDER — EPHEDRINE SULFATE (PRESSORS) 50 MG/ML IJ SOLN
INTRAMUSCULAR | Status: DC | PRN
  Administered 2022-09-28: 5 mg via INTRAVENOUS
  Administered 2022-09-28: 7.5 mg via INTRAVENOUS

## 2022-09-28 MED ORDER — ACETAMINOPHEN 10 MG/ML IV SOLN: 1000.0000 mg | Freq: Once | INTRAVENOUS | Status: DC | PRN

## 2022-09-28 MED ORDER — ACETAMINOPHEN 10 MG/ML IV SOLN
INTRAVENOUS | Status: AC
  Filled 2022-09-28: qty 100

## 2022-09-28 MED ORDER — KETOROLAC TROMETHAMINE 30 MG/ML IJ SOLN
INTRAMUSCULAR | Status: DC | PRN
  Administered 2022-09-28: 30 mg via INTRAVENOUS

## 2022-09-28 MED ORDER — FAMOTIDINE 20 MG PO TABS
ORAL_TABLET | ORAL | Status: AC
  Filled 2022-09-28: qty 1

## 2022-09-28 MED ORDER — MIDAZOLAM HCL 2 MG/2ML IJ SOLN
INTRAMUSCULAR | Status: DC | PRN
  Administered 2022-09-28: 2 mg via INTRAVENOUS

## 2022-09-28 MED ORDER — DEXAMETHASONE SODIUM PHOSPHATE 10 MG/ML IJ SOLN
INTRAMUSCULAR | Status: AC
  Filled 2022-09-28: qty 1

## 2022-09-28 SURGICAL SUPPLY — 50 items
ADHESIVE MASTISOL STRL (MISCELLANEOUS) ×1 IMPLANT
ANCHOR TIS RET SYS 235ML (MISCELLANEOUS) IMPLANT
BAG URINE DRAIN 2000ML AR STRL (UROLOGICAL SUPPLIES) ×1 IMPLANT
BLADE SURG 10 STRL SS SAFETY (BLADE) ×1 IMPLANT
BLADE SURG SZ11 CARB STEEL (BLADE) ×1 IMPLANT
CATH FOLEY 2WAY  5CC 16FR (CATHETERS) ×1
CATH URTH 16FR FL 2W BLN LF (CATHETERS) ×1 IMPLANT
CHLORAPREP W/TINT 26 (MISCELLANEOUS) ×1 IMPLANT
DERMABOND ADVANCED .7 DNX12 (GAUZE/BANDAGES/DRESSINGS) ×1 IMPLANT
ELECT REM PT RETURN 9FT ADLT (ELECTROSURGICAL) ×1
ELECTRODE REM PT RTRN 9FT ADLT (ELECTROSURGICAL) ×1 IMPLANT
GLOVE PI ORTHO PRO STRL 7.5 (GLOVE) ×1 IMPLANT
GOWN STRL REUS W/ TWL LRG LVL3 (GOWN DISPOSABLE) ×2 IMPLANT
GOWN STRL REUS W/ TWL XL LVL3 (GOWN DISPOSABLE) ×2 IMPLANT
GOWN STRL REUS W/TWL LRG LVL3 (GOWN DISPOSABLE) ×2
GOWN STRL REUS W/TWL XL LVL3 (GOWN DISPOSABLE) ×2
GRASPER SUT TROCAR 14GX15 (MISCELLANEOUS) IMPLANT
HANDLE YANKAUER SUCT BULB TIP (MISCELLANEOUS) ×1 IMPLANT
IRRIGATION STRYKERFLOW (MISCELLANEOUS) IMPLANT
IRRIGATOR STRYKERFLOW (MISCELLANEOUS) ×1
IV LACTATED RINGERS 1000ML (IV SOLUTION) ×1 IMPLANT
KIT PINK PAD W/HEAD ARE REST (MISCELLANEOUS) ×1
KIT PINK PAD W/HEAD ARM REST (MISCELLANEOUS) ×1 IMPLANT
KIT TURNOVER CYSTO (KITS) ×1 IMPLANT
LIGASURE LAP MARYLAND 5MM 37CM (ELECTROSURGICAL) IMPLANT
MANIFOLD NEPTUNE II (INSTRUMENTS) ×1 IMPLANT
NDL SPNL 22GX3.5 QUINCKE BK (NEEDLE) ×1 IMPLANT
NEEDLE SPNL 22GX3.5 QUINCKE BK (NEEDLE) ×1 IMPLANT
PACK GYN LAPAROSCOPIC (MISCELLANEOUS) ×1 IMPLANT
PAD OB MATERNITY 4.3X12.25 (PERSONAL CARE ITEMS) ×1 IMPLANT
PAD PREP 24X41 OB/GYN DISP (PERSONAL CARE ITEMS) ×1 IMPLANT
PENCIL SMOKE EVACUATOR (MISCELLANEOUS) ×1 IMPLANT
RETRACTOR PHONTONGUIDE ADAPT (ADAPTER) ×1 IMPLANT
SCRUB CHG 4% DYNA-HEX 4OZ (MISCELLANEOUS) ×1 IMPLANT
SLEEVE Z-THREAD 5X100MM (TROCAR) ×2 IMPLANT
SOLUTION ELECTROLUBE (MISCELLANEOUS) ×1 IMPLANT
STRIP CLOSURE SKIN 1/2X4 (GAUZE/BANDAGES/DRESSINGS) ×1 IMPLANT
SUT VIC AB 0 CT1 27 (SUTURE) ×2
SUT VIC AB 0 CT1 27XCR 8 STRN (SUTURE) ×2 IMPLANT
SUT VIC AB 0 CT1 36 (SUTURE) ×2 IMPLANT
SUT VIC AB 4-0 FS2 27 (SUTURE) ×1 IMPLANT
SUT VICRYL 0 AB UR-6 (SUTURE) IMPLANT
SYR 10ML LL (SYRINGE) ×1 IMPLANT
SYR 50ML LL SCALE MARK (SYRINGE) IMPLANT
SYR CONTROL 10ML LL (SYRINGE) ×1 IMPLANT
TRAP FLUID SMOKE EVACUATOR (MISCELLANEOUS) ×1 IMPLANT
TROCAR XCEL 12X100 BLDLESS (ENDOMECHANICALS) IMPLANT
TROCAR XCEL NON-BLD 5MMX100MML (ENDOMECHANICALS) ×1 IMPLANT
TUBING EVAC SMOKE HEATED PNEUM (TUBING) ×1 IMPLANT
WATER STERILE IRR 500ML POUR (IV SOLUTION) ×1 IMPLANT

## 2022-09-28 NOTE — Op Note (Addendum)
     OPERATIVE NOTE 09/28/2022 9:44 AM  PRE-OPERATIVE DIAGNOSIS:  1) Complex Right Ovarian Mass  POST-OPERATIVE DIAGNOSIS:  1) Same -   OPERATION:  LAPAROSCOPIC RIGHT OOPHORECTOMY: 05697 (CPT)  SURGEON(S): Surgeon(s) and Role:    Harlin Heys, MD - Primary   ANESTHESIA: Choice  ESTIMATED BLOOD LOSS: 10 mL  OPERATIVE FINDINGS: Large right ovarian cyst containing what appears to be old blood, possibly c/w endometrioma. Very large uterus c/w multiple uterine fibroids  SPECIMEN:  ID Type Source Tests Collected by Time Destination  1 : Right Ovary Tissue PATH Other SURGICAL PATHOLOGY Harlin Heys, MD [id number]     COMPLICATIONS: None  DISPOSITION: Stable to recovery room  DESCRIPTION OF PROCEDURE:      The patient was prepped and draped in the dorsolithotomy position and placed under general anesthesia. The bladder was emptied. The cervix was grasped with a multi-toothed tenaculum and a uterine manipulator was placed within the cervical os respecting the position and curvature of the uterus. After changing gloves we proceeded abdominally. A small infraumbilical incision was made and a 5 mm trocar port was placed within the abdominopelvic cavity. The opening pressure was less than 7 mmHg.  Approximately 3 and 1/2 L of carbon dioxide gas was instilled within the abdominal pelvic cavity. The laparoscope was placed and the pelvis and abdomen were carefully inspected. The right adnexa was noted to be very enlarged with a cyst stuck in the cul-de-sac.  The uterus was noted to be significantly enlarged with uterine fibroids making accessibility to the right ovary somewhat difficult.  The right side wall was carefully explored and the ureter was identified and found to be out of the operative field.  The infundibulopelvic ligament was triply coagulated and divided.  The cyst was rolled toward the midline with blunt and sharp dissection being used to free it from the  adhesions in the cul-de-sac.  We were eventually able to identify the utero-ovarian ligament and it was coagulated and divided.  The cyst was placed in an Endo Catch bag and removed through the left lower quadrant port incision.  The pelvis was irrigated.  Hemostasis was noted from all pedicles and from the cul-de-sac.  The right ureter was again identified.  It was noted to be out of the operative field and underwent peristalsis. Hemostasis of all areas of the pelvis was noted. The ports were removed under direct visualization.  Hemostasis was noted.  The PMI was used to close the fascia of the left lower quadrant port.  The other ports were removed and the gas was allowed to escape.  The skin was closed with subcuticular sutures.  An long-acting anesthetic was employed.   Dermabond was applied.  The uterine manipulator was removed from the vagina and hemostasis was noted.  The patient went to recovery in stable condition.   Finis Bud, M.D. 09/28/2022 9:44 AM

## 2022-09-28 NOTE — Progress Notes (Signed)
Patient's s/p surgery this morning. Pharmacy noting Vicodin is on back order. Changed prescription to Percocet.   Dr. Marcelline Mates

## 2022-09-28 NOTE — Anesthesia Preprocedure Evaluation (Addendum)
Anesthesia Evaluation  Patient identified by MRN, date of birth, ID band Patient awake    Reviewed: Allergy & Precautions, NPO status , Patient's Chart, lab work & pertinent test results  Airway Mallampati: III  TM Distance: >3 FB Neck ROM: full    Dental no notable dental hx.    Pulmonary former smoker   Pulmonary exam normal        Cardiovascular negative cardio ROS Normal cardiovascular exam     Neuro/Psych negative neurological ROS  negative psych ROS   GI/Hepatic negative GI ROS, Neg liver ROS,,,  Endo/Other  negative endocrine ROS    Renal/GU      Musculoskeletal   Abdominal  (+)  Abdomen: tender.   Peds  Hematology IDA treated with iron   Anesthesia Other Findings Past Medical History: No date: Anemia No date: Fibroid uterus No date: Iron deficiency anemia No date: Right ovarian cyst  Past Surgical History: 1994: FINGER SURGERY; Right     Comment:  middle finger crushed by slamming door No date: TONSILLECTOMY  BMI    Body Mass Index: 29.99 kg/m      Reproductive/Obstetrics negative OB ROS                             Anesthesia Physical Anesthesia Plan  ASA: 2  Anesthesia Plan: General ETT   Post-op Pain Management: Toradol IV (intra-op)* and Ofirmev IV (intra-op)*   Induction: Intravenous  PONV Risk Score and Plan: 3 and Ondansetron, Dexamethasone and Midazolam  Airway Management Planned: Oral ETT  Additional Equipment:   Intra-op Plan:   Post-operative Plan: Extubation in OR  Informed Consent: I have reviewed the patients History and Physical, chart, labs and discussed the procedure including the risks, benefits and alternatives for the proposed anesthesia with the patient or authorized representative who has indicated his/her understanding and acceptance.     Dental Advisory Given  Plan Discussed with: Anesthesiologist, CRNA and  Surgeon  Anesthesia Plan Comments:         Anesthesia Quick Evaluation

## 2022-09-28 NOTE — Interval H&P Note (Signed)
History and Physical Interval Note:  09/28/2022 7:49 AM  Krista Holloway  has presented today for surgery, with the diagnosis of Complex Right Ovarian Mass.  The various methods of treatment have been discussed with the patient and family. After consideration of risks, benefits and other options for treatment, the patient has consented to  Procedure(s): LAPAROSCOPIC RIGHT OOPHORECTOMY (Right) as a surgical intervention.  The patient's history has been reviewed, patient examined, no change in status, stable for surgery.  I have reviewed the patient's chart and labs.  Questions were answered to the patient's satisfaction.     Jeannie Fend

## 2022-09-28 NOTE — Anesthesia Procedure Notes (Signed)
Procedure Name: Intubation Date/Time: 09/28/2022 7:49 AM  Performed by: Zarra Geffert, [place], CRNAPre-anesthesia Checklist: Patient identified, Patient being monitored, Timeout performed, Emergency Drugs available and Suction available Patient Re-evaluated:Patient Re-evaluated prior to induction Oxygen Delivery Method: Circle system utilized Preoxygenation: Pre-oxygenation with 100% oxygen Induction Type: IV induction Ventilation: Mask ventilation without difficulty Laryngoscope Size: Mac and 3 Grade View: Grade I Tube type: Oral Tube size: 7.0 mm Number of attempts: 1 Airway Equipment and Method: Stylet Placement Confirmation: ETT inserted through vocal cords under direct vision, positive ETCO2 and breath sounds checked- equal and bilateral Secured at: 22 cm Tube secured with: Tape Dental Injury: Teeth and Oropharynx as per pre-operative assessment

## 2022-09-28 NOTE — Discharge Instructions (Signed)
AMBULATORY SURGERY  ?DISCHARGE INSTRUCTIONS ? ? ?The drugs that you were given will stay in your system until tomorrow so for the next 24 hours you should not: ? ?Drive an automobile ?Make any legal decisions ?Drink any alcoholic beverage ? ? ?You may resume regular meals tomorrow.  Today it is better to start with liquids and gradually work up to solid foods. ? ?You may eat anything you prefer, but it is better to start with liquids, then soup and crackers, and gradually work up to solid foods. ? ? ?Please notify your doctor immediately if you have any unusual bleeding, trouble breathing, redness and pain at the surgery site, drainage, fever, or pain not relieved by medication. ? ? ? ?Additional Instructions: ? ? ? ?Please contact your physician with any problems or Same Day Surgery at [phone], Monday through Friday 6 am to 4 pm, or [facility] number at [phone].  ?

## 2022-09-28 NOTE — Transfer of Care (Signed)
Immediate Anesthesia Transfer of Care Note  Patient: Krista Holloway  Procedure(s) Performed: LAPAROSCOPIC RIGHT OOPHORECTOMY (Right: Pelvis)  Patient Location: PACU  Anesthesia Type:General  Level of Consciousness: drowsy  Airway & Oxygen Therapy: Patient Spontanous Breathing and Patient connected to nasal cannula oxygen  Post-op Assessment: Report given to RN and Post -op Vital signs reviewed and stable  Post vital signs: Reviewed and stable  Last Vitals:  Vitals Value Taken Time  BP 112/69 09/28/22 0946  Temp 96.8   Pulse 74 09/28/22 0950  Resp 19 09/28/22 0950  SpO2 100 % 09/28/22 0950  Vitals shown include unvalidated device data.  Last Pain:  Vitals:   09/28/22 0629  PainSc: 3          Complications: No notable events documented.

## 2022-09-28 NOTE — Anesthesia Postprocedure Evaluation (Signed)
Anesthesia Post Note  Patient: Krista Holloway  Procedure(s) Performed: LAPAROSCOPIC RIGHT OOPHORECTOMY (Right: Pelvis)  Patient location during evaluation: PACU Anesthesia Type: General Level of consciousness: awake and alert Pain management: pain level controlled Vital Signs Assessment: post-procedure vital signs reviewed and stable Respiratory status: spontaneous breathing, nonlabored ventilation and respiratory function stable Cardiovascular status: blood pressure returned to baseline and stable Postop Assessment: no apparent nausea or vomiting Anesthetic complications: no   No notable events documented.   Last Vitals:  Vitals:   09/28/22 1000 09/28/22 1015  BP: 115/69 118/76  Pulse: 73 81  Resp: 12 13  Temp:  (!) 36.2 C  SpO2: 96% 96%    Last Pain:  Vitals:   09/28/22 1015  PainSc: 0-No pain                 [place] Ouch

## 2022-09-29 LAB — BPAM RBC
Blood Product Expiration Date: 202401032359
Blood Product Expiration Date: 202401102359
Unit Type and Rh: 5100
Unit Type and Rh: 6200

## 2022-09-29 LAB — TYPE AND SCREEN
ABO/RH(D): A POS
Antibody Screen: POSITIVE
PT AG Type: NEGATIVE
Unit division: 0
Unit division: 0

## 2022-10-01 LAB — SURGICAL PATHOLOGY

## 2022-10-02 ENCOUNTER — Encounter: Payer: Self-pay | Admitting: Obstetrics and Gynecology

## 2022-10-10 ENCOUNTER — Encounter: Payer: Self-pay | Admitting: Obstetrics and Gynecology

## 2022-10-11 ENCOUNTER — Encounter: Payer: Self-pay | Admitting: Obstetrics and Gynecology

## 2022-10-11 ENCOUNTER — Ambulatory Visit (INDEPENDENT_AMBULATORY_CARE_PROVIDER_SITE_OTHER): Payer: 59 | Admitting: Obstetrics and Gynecology

## 2022-10-11 VITALS — BP 113/80 | HR 80 | Ht 70.0 in | Wt 213.4 lb

## 2022-10-11 DIAGNOSIS — D509 Iron deficiency anemia, unspecified: Secondary | ICD-10-CM

## 2022-10-11 DIAGNOSIS — Z9889 Other specified postprocedural states: Secondary | ICD-10-CM

## 2022-10-11 NOTE — Progress Notes (Signed)
HPI:      [name] is a 41 y.o. G0P0000 who LMP was Patient's last menstrual period was 09/14/2022 (exact date).  Subjective:   She presents today postop from left oophorectomy for hemorrhagic cyst.  She reports she is generally doing well.  Her pain is controlled.  She is having bowel movements and voiding without difficulty.  She has some pain with her left abdominal incision but is otherwise well.  She would like to return to work next week.    Hx: The following portions of the patient's history were reviewed and updated as appropriate:             She  has a past medical history of Anemia, Fibroid uterus, Iron deficiency anemia, and Right ovarian cyst. She does not have any pertinent problems on file. She  has a past surgical history that includes Tonsillectomy and Finger surgery (Right, 1994). Her family history includes Diabetes in her father and mother; Glaucoma in her mother; Hypertension in her father and mother; Seizures in her sister. She  reports that she has quit smoking. Her smoking use included cigarettes. She has never used smokeless tobacco. She reports that she does not currently use alcohol. She reports that she does not use drugs. She has a current medication list which includes the following prescription(s): ibuprofen. She is allergic to amoxicillin.       Review of Systems:  Review of Systems  Constitutional: Denied constitutional symptoms, night sweats, recent illness, fatigue, fever, insomnia and weight loss.  Eyes: Denied eye symptoms, eye pain, photophobia, vision change and visual disturbance.  Ears/Nose/Throat/Neck: Denied ear, nose, throat or neck symptoms, hearing loss, nasal discharge, sinus congestion and sore throat.  Cardiovascular: Denied cardiovascular symptoms, arrhythmia, chest pain/pressure, edema, exercise intolerance, orthopnea and palpitations.  Respiratory: Denied pulmonary symptoms, asthma, pleuritic pain, productive sputum, cough, dyspnea  and wheezing.  Gastrointestinal: Denied, gastro-esophageal reflux, melena, nausea and vomiting.  Genitourinary: Denied genitourinary symptoms including symptomatic vaginal discharge, pelvic relaxation issues, and urinary complaints.  Musculoskeletal: Denied musculoskeletal symptoms, stiffness, swelling, muscle weakness and myalgia.  Dermatologic: Denied dermatology symptoms, rash and scar.  Neurologic: Denied neurology symptoms, dizziness, headache, neck pain and syncope.  Psychiatric: Denied psychiatric symptoms, anxiety and depression.  Endocrine: Denied endocrine symptoms including hot flashes and night sweats.   Meds:   Current Outpatient Medications on File Prior to Visit  Medication Sig Dispense Refill   ibuprofen (ADVIL) 600 MG tablet Take 1 tablet (600 mg total) by mouth every 6 (six) hours as needed. 60 tablet 0   No current facility-administered medications on file prior to visit.      Objective:     Vitals:   10/11/22 1540  BP: 113/80  Pulse: 80   Filed Weights   10/11/22 1540  Weight: 213 lb 6.4 oz (96.8 kg)               Abdomen: Soft.  Non-tender.  No masses.  No HSM.  Incision/s: Intact.  Healing well.  No erythema.  No drainage.             Assessment:    G0P0000 Patient Active Problem List   Diagnosis Date Noted   Iron deficiency anemia 02/25/2022     1. 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  Of significant note she has noted to have very large uterine fibroids at the time of surgery.  She also has very heavy menses which is consistent with  the fibroids.   Plan:            1.  Patient may resume normal activities next week-desires return to work.  Follow-up in 3 months. Orders No orders of the defined types were placed in this encounter.   No orders of the defined types were placed in this encounter.     F/U  Return in about 3 months (around 01/10/2023).  Finis Bud, M.D. 10/11/2022 4:08 PM

## 2022-10-11 NOTE — Progress Notes (Signed)
Patient presents for 2 week postop follow-up following right oophorectomy. She states still feeling sore, mainly near her left incision, no longer bleeding and denies urinary symptoms. No additional concerns.

## 2022-10-16 ENCOUNTER — Telehealth: Payer: Self-pay

## 2022-10-16 ENCOUNTER — Encounter: Payer: Self-pay | Admitting: Obstetrics and Gynecology

## 2022-10-16 NOTE — Telephone Encounter (Signed)
Pt calling to see if her FMLA has been completed and if so was it faxed to her office. Please advise

## 2022-12-17 ENCOUNTER — Encounter: Payer: Self-pay | Admitting: Oncology

## 2022-12-24 ENCOUNTER — Encounter: Payer: Self-pay | Admitting: Oncology

## 2022-12-24 ENCOUNTER — Encounter: Payer: Self-pay | Admitting: Internal Medicine

## 2022-12-24 ENCOUNTER — Ambulatory Visit: Payer: BC Managed Care – PPO | Admitting: Internal Medicine

## 2022-12-24 VITALS — BP 120/74 | HR 70 | Ht 70.0 in | Wt 214.0 lb

## 2022-12-24 DIAGNOSIS — R5383 Other fatigue: Secondary | ICD-10-CM

## 2022-12-24 DIAGNOSIS — Z20822 Contact with and (suspected) exposure to covid-19: Secondary | ICD-10-CM | POA: Diagnosis not present

## 2022-12-24 DIAGNOSIS — D649 Anemia, unspecified: Secondary | ICD-10-CM | POA: Diagnosis not present

## 2022-12-24 LAB — POCT XPERT XPRESS SARS COVID-2/FLU/RSV
FLU A: NEGATIVE
FLU B: NEGATIVE
RSV RNA, PCR: NEGATIVE
SARS Coronavirus 2: NEGATIVE

## 2022-12-24 NOTE — Progress Notes (Signed)
Established Patient Office Visit  Subjective:  Patient ID: Krista Holloway, female    DOB: 1981/02/04  Age: 42 y.o. MRN: [id number]  Chief Complaint  Patient presents with   Follow-up    4 month follow up    Patient comes in for a follow-up but she says she is not feeling well these days.  Few days ago she started feeling extremely tired, no energy, no appetite.  She has not been exposed to anyone with COVID or upper respiratory tract infections.  She herself does not have any sore throat, no ear pain or pressure, no cough, no nausea or vomiting, and no diarrhea or constipation. Her most recent labs were done in November and they showed a normal hemoglobin.  She is due for labs today.     Past Medical History:  Diagnosis Date   Anemia    Fibroid uterus    Iron deficiency anemia    Right ovarian cyst     Past Surgical History:  Procedure Laterality Date   FINGER SURGERY Right 1994   middle finger crushed by slamming door   oophrectomy Right    TONSILLECTOMY      Social History   Socioeconomic History   Marital status: Single    Spouse name: Not on file   Number of children: 0   Years of education: Not on file   Highest education level: Not on file  Occupational History   Not on file  Tobacco Use   Smoking status: Former    Types: Cigarettes   Smokeless tobacco: Never  Vaping Use   Vaping Use: Every day   Substances: Nicotine  Substance and Sexual Activity   Alcohol use: Not Currently    Comment: socially   Drug use: Never   Sexual activity: Not Currently    Birth control/protection: Condom  Other Topics Concern   Not on file  Social History Narrative   Lives with mother   Social Determinants of Health   Financial Resource Strain: Not on file  Food Insecurity: Not on file  Transportation Needs: Not on file  Physical Activity: Not on file  Stress: Not on file  Social Connections: Not on file  Intimate Partner Violence: Not on file    Family  History  Problem Relation Age of Onset   Diabetes Mother    Glaucoma Mother    Hypertension Mother    Diabetes Father    Hypertension Father    Seizures Sister    Breast cancer Neg Hx     Allergies  Allergen Reactions   Amoxicillin     Swelling of lips Dry skin     Review of Systems  Constitutional:  Positive for malaise/fatigue. Negative for chills, diaphoresis, fever and weight loss.  HENT: Negative.  Negative for congestion, ear pain, sinus pain and sore throat.   Eyes: Negative.   Respiratory: Negative.  Negative for cough, shortness of breath and wheezing.   Cardiovascular: Negative.  Negative for chest pain.  Gastrointestinal:  Negative for abdominal pain, blood in stool, constipation, diarrhea, melena, nausea and vomiting.  Genitourinary: Negative.   Musculoskeletal: Negative.   Skin: Negative.   Neurological: Negative.   Psychiatric/Behavioral: Negative.         Objective:   BP 120/74   Pulse 70   Ht '5\' 10"'$  (1.778 m)   Wt 214 lb (97.1 kg)   SpO2 99%   BMI 30.71 kg/m   Vitals:   12/24/22 1543  BP: 120/74  Pulse:  70  Height: '5\' 10"'$  (1.778 m)  Weight: 214 lb (97.1 kg)  SpO2: 99%  BMI (Calculated): 30.71    Physical Exam Vitals and nursing note reviewed.  Constitutional:      Appearance: Normal appearance.  HENT:     Right Ear: Tympanic membrane normal.     Left Ear: Tympanic membrane normal.  Cardiovascular:     Rate and Rhythm: Normal rate and regular rhythm.     Pulses: Normal pulses.     Heart sounds: Normal heart sounds.  Pulmonary:     Effort: Pulmonary effort is normal.  Abdominal:     General: Abdomen is flat.     Palpations: Abdomen is soft.  Musculoskeletal:        General: Normal range of motion.     Cervical back: Normal range of motion and neck supple.  Skin:    General: Skin is warm.  Neurological:     General: No focal deficit present.     Mental Status: She is alert and oriented to person, place, and time.       Results for orders placed or performed in visit on 12/24/22  POCT XPERT XPRESS SARS COVID-2/FLU/RSV [id number]  Result Value Ref Range   SARS Coronavirus 2 Negative    FLU A Negative    FLU B Negative    RSV RNA, PCR Negative         Assessment & Plan:  Patient advised to drink plenty of fluids.   Her COVID test is negative.   Some other labs have been ordered.  Patient will return in 1 week to discuss blood work.  She can come in sooner if she starts to feel worse. Problem List Items Addressed This Visit   None Visit Diagnoses     Other fatigue    -  Primary   Relevant Orders   CMP14+EGFR   TSH+T4F+T3Free   EPSTEIN-BARR VIRUS (EBV) Antibody Profile- [facility]   POCT XPERT XPRESS SARS COVID-2/FLU/RSV [id number] (Completed)   Anemia, unspecified type       Relevant Orders   CBC With Differential       Return in about 1 week (around 12/31/2022).   Total time spent: 30 minutes  Perrin Maltese, MD  12/24/2022

## 2022-12-27 ENCOUNTER — Other Ambulatory Visit: Payer: BC Managed Care – PPO

## 2022-12-28 ENCOUNTER — Other Ambulatory Visit: Payer: Self-pay

## 2022-12-28 ENCOUNTER — Other Ambulatory Visit: Payer: BC Managed Care – PPO

## 2022-12-28 DIAGNOSIS — R5383 Other fatigue: Secondary | ICD-10-CM

## 2022-12-29 LAB — CMP14+EGFR
ALT: 8 IU/L (ref 0–32)
AST: 19 IU/L (ref 0–40)
Albumin/Globulin Ratio: 1.3 (ref 1.2–2.2)
Albumin: 4.2 g/dL (ref 3.9–4.9)
Alkaline Phosphatase: 60 IU/L (ref 44–121)
BUN/Creatinine Ratio: 9 (ref 9–23)
BUN: 8 mg/dL (ref 6–24)
Bilirubin Total: <0.2 mg/dL (ref 0.0–1.2)
CO2: 16 mmol/L — ABNORMAL LOW (ref 20–29)
Calcium: 9.4 mg/dL (ref 8.7–10.2)
Chloride: 105 mmol/L (ref 96–106)
Creatinine, Ser: 0.87 mg/dL (ref 0.57–1.00)
Globulin, Total: 3.2 g/dL (ref 1.5–4.5)
Glucose: 68 mg/dL — ABNORMAL LOW (ref 70–99)
Potassium: 3.8 mmol/L (ref 3.5–5.2)
Sodium: 139 mmol/L (ref 134–144)
Total Protein: 7.4 g/dL (ref 6.0–8.5)
eGFR: 86 mL/min/{1.73_m2} (ref >59)

## 2022-12-31 ENCOUNTER — Ambulatory Visit: Payer: BC Managed Care – PPO | Admitting: Internal Medicine

## 2022-12-31 ENCOUNTER — Encounter: Payer: Self-pay | Admitting: Internal Medicine

## 2022-12-31 VITALS — BP 130/80 | HR 67 | Ht 70.0 in | Wt 222.0 lb

## 2022-12-31 DIAGNOSIS — R030 Elevated blood-pressure reading, without diagnosis of hypertension: Secondary | ICD-10-CM | POA: Diagnosis not present

## 2022-12-31 DIAGNOSIS — R5383 Other fatigue: Secondary | ICD-10-CM | POA: Diagnosis not present

## 2022-12-31 DIAGNOSIS — E669 Obesity, unspecified: Secondary | ICD-10-CM

## 2022-12-31 NOTE — Progress Notes (Signed)
Established Patient Office Visit  Subjective:  Patient ID: Krista Holloway, female    DOB: 12/10/1980  Age: 42 y.o. MRN: [id number]  Chief Complaint  Patient presents with   Follow-up    1 week follow up    Patient comes in for a follow-up from her last visit.  She had complained of excessive fatigue with no other symptoms.  Her labs were ordered which included a CBC and Chemistry panel, Epstein-Barr Viral  titers and a Thyroid profile.  Patient was a very difficult stick and only small amount  of blood was obtained out of which CMP was done, essentially normal except low glucose. Patient admitted that she had not been drinking enough water so it was difficult to get blood from her veins.  She has been advised to come back for the rest of her lab orders. However today she is feeling much better.  She says she is not feeling tired and fatigued like she did last time.  Although no treatment was prescribed but she was advised to rest and drink plenty of fluids. Today patient's blood pressure is slightly above normal.  She is concerned about her weight as well.  Would like to discuss weight loss measures.  Patient advised to start avoiding salt and NSAIDs.  And actively start physical exercises and to cut back on the portion sizes and high-calorie food.  Will monitor her blood pressure and weight.    No other concerns at this time.   Past Medical History:  Diagnosis Date   Anemia    Fibroid uterus    Iron deficiency anemia    Right ovarian cyst     Past Surgical History:  Procedure Laterality Date   FINGER SURGERY Right 1994   middle finger crushed by slamming door   oophrectomy Right    TONSILLECTOMY      Social History   Socioeconomic History   Marital status: Single    Spouse name: Not on file   Number of children: 0   Years of education: Not on file   Highest education level: Not on file  Occupational History   Not on file  Tobacco Use   Smoking status: Former     Types: Cigarettes   Smokeless tobacco: Never  Vaping Use   Vaping Use: Every day   Substances: Nicotine  Substance and Sexual Activity   Alcohol use: Not Currently    Comment: socially   Drug use: Never   Sexual activity: Not Currently    Birth control/protection: Condom  Other Topics Concern   Not on file  Social History Narrative   Lives with mother   Social Determinants of Health   Financial Resource Strain: Not on file  Food Insecurity: Not on file  Transportation Needs: Not on file  Physical Activity: Not on file  Stress: Not on file  Social Connections: Not on file  Intimate Partner Violence: Not on file    Family History  Problem Relation Age of Onset   Diabetes Mother    Glaucoma Mother    Hypertension Mother    Diabetes Father    Hypertension Father    Seizures Sister    Breast cancer Neg Hx     Allergies  Allergen Reactions   Amoxicillin     Swelling of lips Dry skin     Review of Systems  Constitutional: Negative.   HENT: Negative.    Eyes: Negative.   Respiratory: Negative.    Cardiovascular: Negative.  Gastrointestinal: Negative.   Genitourinary: Negative.   Musculoskeletal: Negative.   Skin: Negative.   Neurological: Negative.   Psychiatric/Behavioral: Negative.         Objective:   BP 130/80   Pulse 67   Ht 5\' 10"  (1.778 m)   Wt 222 lb (100.7 kg)   SpO2 92%   BMI 31.85 kg/m   Vitals:   12/31/22 1525  BP: 130/80  Pulse: 67  Height: 5\' 10"  (1.778 m)  Weight: 222 lb (100.7 kg)  SpO2: 92%  BMI (Calculated): 31.85    Physical Exam Vitals and nursing note reviewed.  Constitutional:      Appearance: Normal appearance. She is obese.  Cardiovascular:     Rate and Rhythm: Normal rate and regular rhythm.  Pulmonary:     Effort: Pulmonary effort is normal.     Breath sounds: Normal breath sounds.  Abdominal:     General: Abdomen is flat.     Palpations: Abdomen is soft.  Musculoskeletal:        General: Normal range of  motion.     Cervical back: Normal range of motion and neck supple.  Skin:    General: Skin is warm.  Neurological:     Mental Status: She is alert and oriented to person, place, and time.      No results found for any visits on 12/31/22.  Recent Results (from the past 2160 hour(s))  POCT XPERT XPRESS SARS COVID-2/FLU/RSV [id number]     Status: Normal   Collection Time: 12/24/22  4:31 PM  Result Value Ref Range   SARS Coronavirus 2 Negative    FLU A Negative    FLU B Negative    RSV RNA, PCR Negative   CMP14+EGFR     Status: Abnormal   Collection Time: 12/28/22  1:25 PM  Result Value Ref Range   Glucose 68 (L) 70 - 99 mg/dL   BUN 8 6 - 24 mg/dL   Creatinine, Ser 0.87 0.57 - 1.00 mg/dL   eGFR 86 >59 mL/min/1.73   BUN/Creatinine Ratio 9 9 - 23   Sodium 139 134 - 144 mmol/L   Potassium 3.8 3.5 - 5.2 mmol/L   Chloride 105 96 - 106 mmol/L   CO2 16 (L) 20 - 29 mmol/L    Comment: **Verified by repeat analysis**   Calcium 9.4 8.7 - 10.2 mg/dL   Total Protein 7.4 6.0 - 8.5 g/dL   Albumin 4.2 3.9 - 4.9 g/dL   Globulin, Total 3.2 1.5 - 4.5 g/dL   Albumin/Globulin Ratio 1.3 1.2 - 2.2   Bilirubin Total <0.2 0.0 - 1.2 mg/dL   Alkaline Phosphatase 60 44 - 121 IU/L   AST 19 0 - 40 IU/L   ALT 8 0 - 32 IU/L      Assessment & Plan:  Monitor weight and blood pressure. Patient will get the rest of the blood work ordered. Problem List Items Addressed This Visit     Prehypertension - Primary   Obesity (BMI 30-39.9)   Other fatigue    Follow up in 1 month.   Total time spent: 25 minutes  Perrin Maltese, MD  12/31/2022

## 2023-01-09 ENCOUNTER — Encounter: Payer: 59 | Admitting: Obstetrics and Gynecology

## 2023-01-10 ENCOUNTER — Encounter: Payer: 59 | Admitting: Obstetrics and Gynecology

## 2023-01-10 DIAGNOSIS — Z9889 Other specified postprocedural states: Secondary | ICD-10-CM

## 2023-01-12 ENCOUNTER — Encounter: Payer: Self-pay | Admitting: Oncology

## 2023-01-14 ENCOUNTER — Inpatient Hospital Stay: Payer: BC Managed Care – PPO | Attending: Oncology

## 2023-01-14 DIAGNOSIS — N920 Excessive and frequent menstruation with regular cycle: Secondary | ICD-10-CM | POA: Diagnosis not present

## 2023-01-14 DIAGNOSIS — D75838 Other thrombocytosis: Secondary | ICD-10-CM | POA: Insufficient documentation

## 2023-01-14 DIAGNOSIS — D509 Iron deficiency anemia, unspecified: Secondary | ICD-10-CM | POA: Diagnosis present

## 2023-01-14 LAB — IRON AND TIBC
Iron: 14 ug/dL — ABNORMAL LOW (ref 28–170)
Saturation Ratios: 3 % — ABNORMAL LOW (ref 10.4–31.8)
TIBC: 433 ug/dL (ref 250–450)
UIBC: 419 ug/dL

## 2023-01-14 LAB — CBC WITH DIFFERENTIAL/PLATELET
Abs Immature Granulocytes: 0.01 10*3/uL (ref 0.00–0.07)
Basophils Absolute: 0.1 10*3/uL (ref 0.0–0.1)
Basophils Relative: 1 %
Eosinophils Absolute: 0 10*3/uL (ref 0.0–0.5)
Eosinophils Relative: 1 %
HCT: 32.3 % — ABNORMAL LOW (ref 36.0–46.0)
Hemoglobin: 9.8 g/dL — ABNORMAL LOW (ref 12.0–15.0)
Immature Granulocytes: 0 %
Lymphocytes Relative: 39 %
Lymphs Abs: 2.7 10*3/uL (ref 0.7–4.0)
MCH: 24.3 pg — ABNORMAL LOW (ref 26.0–34.0)
MCHC: 30.3 g/dL (ref 30.0–36.0)
MCV: 80.1 fL (ref 80.0–100.0)
Monocytes Absolute: 0.4 10*3/uL (ref 0.1–1.0)
Monocytes Relative: 6 %
Neutro Abs: 3.6 10*3/uL (ref 1.7–7.7)
Neutrophils Relative %: 53 %
Platelets: 425 10*3/uL — ABNORMAL HIGH (ref 150–400)
RBC: 4.03 MIL/uL (ref 3.87–5.11)
RDW: 14.8 % (ref 11.5–15.5)
WBC: 6.8 10*3/uL (ref 4.0–10.5)
nRBC: 0 % (ref 0.0–0.2)

## 2023-01-14 LAB — FERRITIN: Ferritin: 3 ng/mL — ABNORMAL LOW (ref 11–307)

## 2023-01-17 ENCOUNTER — Inpatient Hospital Stay (HOSPITAL_BASED_OUTPATIENT_CLINIC_OR_DEPARTMENT_OTHER): Payer: BC Managed Care – PPO | Admitting: Oncology

## 2023-01-17 ENCOUNTER — Inpatient Hospital Stay: Payer: BC Managed Care – PPO

## 2023-01-17 VITALS — BP 125/81 | HR 73 | Temp 97.7°F | Resp 18 | Wt 222.6 lb

## 2023-01-17 DIAGNOSIS — D509 Iron deficiency anemia, unspecified: Secondary | ICD-10-CM | POA: Diagnosis not present

## 2023-01-17 MED ORDER — SODIUM CHLORIDE 0.9 % IV SOLN
INTRAVENOUS | Status: DC
  Filled 2023-01-17: qty 250

## 2023-01-17 MED ORDER — SODIUM CHLORIDE 0.9 % IV SOLN
200.0000 mg | Freq: Once | INTRAVENOUS | Status: AC
  Administered 2023-01-17: 200 mg via INTRAVENOUS
  Filled 2023-01-17: qty 200

## 2023-01-17 NOTE — Progress Notes (Signed)
Patient here today for follow up regarding anemia.  

## 2023-01-17 NOTE — Progress Notes (Signed)
[facility]  [phone] Fax:(336) [phone]  ID: Krista Holloway OB: 04/05/1981  MR#: [id number]  [id number]  Patient Care Team: Perrin Maltese, MD as PCP - General (Internal Medicine)  CHIEF COMPLAINT: Iron deficiency anemia.   INTERVAL HISTORY: Patient returns to clinic today for repeat laboratory work, further evaluation, and consideration of additional IV Venofer.  She underwent surgery in December to remove the large cystic mass in her ovary.  No malignancy was identified.  Patient states she had heavy menses for several months after, but her most recent one has been lighter.  She otherwise feels well. She has no neurologic complaints.  She denies any recent fevers or illnesses.  She has a good appetite and denies weight loss.  She has no chest pain, shortness of breath, cough, or hemoptysis.  She denies any nausea, vomiting, constipation, or diarrhea.  She has no melena or hematochezia.  She has no urinary complaints.  Patient offers no further specific complaints today.  REVIEW OF SYSTEMS:   Review of Systems  Constitutional: Negative.  Negative for fever, malaise/fatigue and weight loss.  Respiratory: Negative.  Negative for cough, hemoptysis and shortness of breath.   Cardiovascular: Negative.  Negative for chest pain and leg swelling.  Gastrointestinal: Negative.  Negative for abdominal pain, blood in stool and melena.  Genitourinary: Negative.  Negative for hematuria.  Musculoskeletal: Negative.  Negative for back pain.  Skin: Negative.  Negative for rash.  Neurological: Negative.  Negative for dizziness, focal weakness and weakness.  Psychiatric/Behavioral: Negative.  The patient is not nervous/anxious.     As per HPI. Otherwise, a complete review of systems is negative.  PAST MEDICAL HISTORY: Past Medical History:  Diagnosis Date   Anemia    Fibroid uterus    Iron deficiency anemia    Right ovarian cyst     PAST SURGICAL  HISTORY: Past Surgical History:  Procedure Laterality Date   FINGER SURGERY Right 1994   middle finger crushed by slamming door   oophrectomy Right    TONSILLECTOMY      FAMILY HISTORY: Family History  Problem Relation Age of Onset   Diabetes Mother    Glaucoma Mother    Hypertension Mother    Diabetes Father    Hypertension Father    Seizures Sister    Breast cancer Neg Hx     ADVANCED DIRECTIVES (Y/N):  N  HEALTH MAINTENANCE: Social History   Tobacco Use   Smoking status: Former    Types: Cigarettes   Smokeless tobacco: Never  Vaping Use   Vaping Use: Every day   Substances: Nicotine  Substance Use Topics   Alcohol use: Not Currently    Comment: socially   Drug use: Never     Colonoscopy:  PAP:  Bone density:  Lipid panel:  Allergies  Allergen Reactions   Amoxicillin     Swelling of lips Dry skin     Current Outpatient Medications  Medication Sig Dispense Refill   ibuprofen (ADVIL) 600 MG tablet Take 1 tablet (600 mg total) by mouth every 6 (six) hours as needed. 60 tablet 0   No current facility-administered medications for this visit.   Facility-Administered Medications Ordered in Other Visits  Medication Dose Route Frequency Provider Last Rate Last Admin   0.9 %  sodium chloride infusion   Intravenous Continuous Lloyd Huger, MD 10 mL/hr at 01/17/23 1538 New Bag at 01/17/23 1538   iron sucrose (VENOFER) 200 mg in sodium chloride 0.9 %  100 mL IVPB  200 mg Intravenous Once Lloyd Huger, MD 440 mL/hr at 01/17/23 1540 200 mg at 01/17/23 1540    OBJECTIVE: Vitals:   01/17/23 1459  BP: 125/81  Pulse: 73  Resp: 18  Temp: 97.7 F (36.5 C)  SpO2: 100%     Body mass index is 31.94 kg/m.    ECOG FS:0 - Asymptomatic  General: Well-developed, well-nourished, no acute distress. Eyes: Pink conjunctiva, anicteric sclera. HEENT: Normocephalic, moist mucous membranes. Lungs: No audible wheezing or coughing. Heart: Regular rate and  rhythm. Abdomen: Soft, nontender, no obvious distention. Musculoskeletal: No edema, cyanosis, or clubbing. Neuro: Alert, answering all questions appropriately. Cranial nerves grossly intact. Skin: No rashes or petechiae noted. Psych: Normal affect.  LAB RESULTS:  Lab Results  Component Value Date   NA 139 12/28/2022   K 3.8 12/28/2022   CL 105 12/28/2022   CO2 16 (L) 12/28/2022   GLUCOSE 68 (L) 12/28/2022   BUN 8 12/28/2022   CREATININE 0.87 12/28/2022   CALCIUM 9.4 12/28/2022   PROT 7.4 12/28/2022   ALBUMIN 4.2 12/28/2022   AST 19 12/28/2022   ALT 8 12/28/2022   ALKPHOS 60 12/28/2022   BILITOT <0.2 12/28/2022   GFRNONAA >60 09/12/2022    Lab Results  Component Value Date   WBC 6.8 01/14/2023   NEUTROABS 3.6 01/14/2023   HGB 9.8 (L) 01/14/2023   HCT 32.3 (L) 01/14/2023   MCV 80.1 01/14/2023   PLT 425 (H) 01/14/2023   Lab Results  Component Value Date   IRON 14 (L) 01/14/2023   TIBC 433 01/14/2023   IRONPCTSAT 3 (L) 01/14/2023   Lab Results  Component Value Date   FERRITIN 3 (L) 01/14/2023     STUDIES: No results found.  ASSESSMENT:  Iron deficiency anemia.   PLAN:    Iron deficiency anemia: Likely secondary to heavy menses.  Patient's hemoglobin has declined to 9.8 and her iron stores are also significantly reduced.  Proceed with 200 mg IV Venofer today.  Patient then return to clinic 4 times over the next 2 weeks for treatment.  Return to clinic in 4 months with repeat laboratory, further evaluation, and continuation of treatment if needed.   Thrombocytosis: Secondary to iron deficiency. Right adnexal cystic mass: Removed on September 28, 2022.  No malignancy was identified. Heavy menses: Patient stated initially menses were not affected by surgery, but this past month her menses has been lighter.  Continue follow-up with OB/GYN as indicated.  Patient expressed understanding and was in agreement with this plan. She also understands that She can call clinic  at any time with any questions, concerns, or complaints.    Lloyd Huger, MD   01/17/2023 3:44 PM

## 2023-01-22 ENCOUNTER — Inpatient Hospital Stay: Payer: BC Managed Care – PPO

## 2023-01-22 VITALS — BP 130/74 | HR 67 | Temp 99.0°F | Resp 16

## 2023-01-22 DIAGNOSIS — D509 Iron deficiency anemia, unspecified: Secondary | ICD-10-CM | POA: Diagnosis not present

## 2023-01-22 MED ORDER — SODIUM CHLORIDE 0.9 % IV SOLN
200.0000 mg | Freq: Once | INTRAVENOUS | Status: AC
  Administered 2023-01-22: 200 mg via INTRAVENOUS
  Filled 2023-01-22: qty 200

## 2023-01-22 MED ORDER — SODIUM CHLORIDE 0.9 % IV SOLN
INTRAVENOUS | Status: DC | PRN
  Filled 2023-01-22: qty 250

## 2023-01-23 MED FILL — Iron Sucrose Inj 20 MG/ML (Fe Equiv): INTRAVENOUS | Qty: 10 | Status: AC

## 2023-01-24 ENCOUNTER — Inpatient Hospital Stay: Payer: BC Managed Care – PPO

## 2023-01-24 VITALS — BP 113/77

## 2023-01-24 DIAGNOSIS — D509 Iron deficiency anemia, unspecified: Secondary | ICD-10-CM

## 2023-01-24 MED ORDER — SODIUM CHLORIDE 0.9 % IV SOLN
INTRAVENOUS | Status: DC
  Filled 2023-01-24: qty 250

## 2023-01-24 MED ORDER — SODIUM CHLORIDE 0.9 % IV SOLN
200.0000 mg | Freq: Once | INTRAVENOUS | Status: AC
  Administered 2023-01-24: 200 mg via INTRAVENOUS
  Filled 2023-01-24: qty 200

## 2023-01-28 ENCOUNTER — Ambulatory Visit: Payer: BC Managed Care – PPO | Admitting: Internal Medicine

## 2023-01-28 ENCOUNTER — Encounter: Payer: Self-pay | Admitting: Internal Medicine

## 2023-01-28 VITALS — BP 112/70 | HR 86 | Ht 70.0 in | Wt 219.0 lb

## 2023-01-28 DIAGNOSIS — D649 Anemia, unspecified: Secondary | ICD-10-CM

## 2023-01-28 DIAGNOSIS — R030 Elevated blood-pressure reading, without diagnosis of hypertension: Secondary | ICD-10-CM | POA: Diagnosis not present

## 2023-01-28 DIAGNOSIS — F32A Depression, unspecified: Secondary | ICD-10-CM | POA: Diagnosis not present

## 2023-01-28 MED ORDER — ESCITALOPRAM OXALATE 10 MG PO TABS
10.0000 mg | ORAL_TABLET | Freq: Every day | ORAL | 6 refills | Status: AC
Start: 2023-01-28 — End: 2023-08-26

## 2023-01-28 NOTE — Progress Notes (Signed)
Established Patient Office Visit  Subjective:  Patient ID: Krista Holloway, female    DOB: 1981/06/15  Age: 42 y.o. MRN: [id number]  Chief Complaint  Patient presents with   Follow-up    4 week follow up    Patient comes in for her follow-up today.  She has been under care of hematology.  She is getting IV iron infusions.  Her energy level has improved significantly.  Her labs are done at the hematologist prior to her I/v infusions. Today however patient reports of feeling depressed.  She mentions that she has suffered from depression off and on throughout her life but was able to manage it herself.  She has never wanted to take any medications or psychotherapy for it. However now she feels like she needs help.  She denies any suicidal ideation or attempts. Her PHQ-9/GAD score is 18/12. Patient is agreeable to start taking an SSRI.  Will prescribe Lexapro at 10 mg/day.   Patient is also agreeable to set up session with psychologist.    No other concerns at this time.   Past Medical History:  Diagnosis Date   Anemia    Fibroid uterus    Iron deficiency anemia    Right ovarian cyst     Past Surgical History:  Procedure Laterality Date   FINGER SURGERY Right 1994   middle finger crushed by slamming door   oophrectomy Right    TONSILLECTOMY      Social History   Socioeconomic History   Marital status: Single    Spouse name: Not on file   Number of children: 0   Years of education: Not on file   Highest education level: Not on file  Occupational History   Not on file  Tobacco Use   Smoking status: Former    Types: Cigarettes   Smokeless tobacco: Never  Vaping Use   Vaping Use: Every day   Substances: Nicotine  Substance and Sexual Activity   Alcohol use: Not Currently    Comment: socially   Drug use: Never   Sexual activity: Not Currently    Birth control/protection: Condom  Other Topics Concern   Not on file  Social History Narrative   Lives with mother    Social Determinants of Health   Financial Resource Strain: Not on file  Food Insecurity: Not on file  Transportation Needs: Not on file  Physical Activity: Not on file  Stress: Not on file  Social Connections: Not on file  Intimate Partner Violence: Not on file    Family History  Problem Relation Age of Onset   Diabetes Mother    Glaucoma Mother    Hypertension Mother    Diabetes Father    Hypertension Father    Seizures Sister    Breast cancer Neg Hx     Allergies  Allergen Reactions   Amoxicillin     Swelling of lips Dry skin     Review of Systems  Constitutional:  Positive for malaise/fatigue. Negative for chills, diaphoresis, fever and weight loss.  HENT:  Negative for congestion, ear discharge, ear pain, hearing loss, nosebleeds and tinnitus.   Eyes:  Negative for blurred vision, double vision and discharge.  Respiratory:  Negative for cough and shortness of breath.   Cardiovascular:  Negative for chest pain and palpitations.  Gastrointestinal:  Negative for constipation, diarrhea, heartburn, nausea and vomiting.  Genitourinary:  Negative for dysuria, frequency and urgency.  Musculoskeletal:  Negative for back pain, falls, joint pain, myalgias  and neck pain.  Skin:  Negative for rash.  Neurological:  Negative for tingling, loss of consciousness, weakness and headaches.  Psychiatric/Behavioral:  Positive for depression. Negative for hallucinations, memory loss and suicidal ideas. The patient is not nervous/anxious and does not have insomnia.        Objective:   Pulse 86   Ht 5\' 10"  (1.778 m)   Wt 219 lb (99.3 kg)   SpO2 99%   BMI 31.42 kg/m   Vitals:   01/28/23 1454  Pulse: 86  Height: 5\' 10"  (1.778 m)  Weight: 219 lb (99.3 kg)  SpO2: 99%  BMI (Calculated): 31.42    Physical Exam Vitals and nursing note reviewed.  Constitutional:      Appearance: Normal appearance. She is obese.  Cardiovascular:     Rate and Rhythm: Normal rate and regular  rhythm.     Pulses: Normal pulses.     Heart sounds: Normal heart sounds.  Pulmonary:     Effort: Pulmonary effort is normal.  Abdominal:     General: Bowel sounds are normal.     Palpations: Abdomen is soft.  Musculoskeletal:        General: Normal range of motion.     Cervical back: Normal range of motion and neck supple.  Skin:    General: Skin is warm.  Neurological:     General: No focal deficit present.     Mental Status: She is alert and oriented to person, place, and time.  Psychiatric:        Mood and Affect: Mood normal.        Behavior: Behavior normal.        Thought Content: Thought content normal.      No results found for any visits on 01/28/23.      Assessment & Plan:  Prescription sent for Lexapro.  Patient will start taking it once a day.  She will also set up an appointment with a psychologist. Problem List Items Addressed This Visit     Prehypertension   Depression - Primary   Relevant Medications   escitalopram (LEXAPRO) 10 MG tablet   Anemia    Return in about 4 weeks (around 02/25/2023).   Total time spent: 20 minutes  Margaretann Loveless, MD  01/28/2023

## 2023-01-29 ENCOUNTER — Inpatient Hospital Stay: Payer: BC Managed Care – PPO

## 2023-01-29 VITALS — BP 110/72 | HR 74 | Temp 98.2°F | Resp 16

## 2023-01-29 DIAGNOSIS — D509 Iron deficiency anemia, unspecified: Secondary | ICD-10-CM | POA: Diagnosis not present

## 2023-01-29 MED ORDER — SODIUM CHLORIDE 0.9 % IV SOLN
INTRAVENOUS | Status: DC
  Filled 2023-01-29: qty 250

## 2023-01-29 MED ORDER — SODIUM CHLORIDE 0.9 % IV SOLN
200.0000 mg | Freq: Once | INTRAVENOUS | Status: AC
  Administered 2023-01-29: 200 mg via INTRAVENOUS
  Filled 2023-01-29: qty 200

## 2023-01-29 NOTE — Patient Instructions (Signed)

## 2023-01-30 MED FILL — Iron Sucrose Inj 20 MG/ML (Fe Equiv): INTRAVENOUS | Qty: 10 | Status: AC

## 2023-01-31 ENCOUNTER — Inpatient Hospital Stay: Payer: BC Managed Care – PPO

## 2023-01-31 VITALS — BP 118/74 | HR 73 | Temp 99.2°F | Resp 16

## 2023-01-31 DIAGNOSIS — D509 Iron deficiency anemia, unspecified: Secondary | ICD-10-CM

## 2023-01-31 MED ORDER — SODIUM CHLORIDE 0.9 % IV SOLN
200.0000 mg | Freq: Once | INTRAVENOUS | Status: AC
  Administered 2023-01-31: 200 mg via INTRAVENOUS
  Filled 2023-01-31: qty 200

## 2023-01-31 MED ORDER — SODIUM CHLORIDE 0.9 % IV SOLN
INTRAVENOUS | Status: DC
  Filled 2023-01-31: qty 250

## 2023-01-31 NOTE — Patient Instructions (Signed)

## 2023-02-11 ENCOUNTER — Other Ambulatory Visit: Payer: Self-pay | Admitting: Internal Medicine

## 2023-02-11 DIAGNOSIS — F32A Depression, unspecified: Secondary | ICD-10-CM

## 2023-02-25 ENCOUNTER — Ambulatory Visit: Payer: BC Managed Care – PPO | Admitting: Internal Medicine

## 2023-05-22 ENCOUNTER — Other Ambulatory Visit: Payer: Self-pay

## 2023-05-22 ENCOUNTER — Inpatient Hospital Stay: Payer: BC Managed Care – PPO | Attending: Nurse Practitioner

## 2023-05-22 DIAGNOSIS — D509 Iron deficiency anemia, unspecified: Secondary | ICD-10-CM

## 2023-05-22 DIAGNOSIS — N938 Other specified abnormal uterine and vaginal bleeding: Secondary | ICD-10-CM | POA: Insufficient documentation

## 2023-05-22 DIAGNOSIS — D259 Leiomyoma of uterus, unspecified: Secondary | ICD-10-CM | POA: Insufficient documentation

## 2023-05-22 DIAGNOSIS — D75838 Other thrombocytosis: Secondary | ICD-10-CM | POA: Diagnosis not present

## 2023-05-22 LAB — CBC WITH DIFFERENTIAL/PLATELET
Abs Immature Granulocytes: 0.02 10*3/uL (ref 0.00–0.07)
Basophils Absolute: 0.1 10*3/uL (ref 0.0–0.1)
Basophils Relative: 1 %
Eosinophils Absolute: 0.1 10*3/uL (ref 0.0–0.5)
Eosinophils Relative: 1 %
HCT: 34.7 % — ABNORMAL LOW (ref 36.0–46.0)
Hemoglobin: 10.8 g/dL — ABNORMAL LOW (ref 12.0–15.0)
Immature Granulocytes: 0 %
Lymphocytes Relative: 37 %
Lymphs Abs: 2.8 10*3/uL (ref 0.7–4.0)
MCH: 25.2 pg — ABNORMAL LOW (ref 26.0–34.0)
MCHC: 31.1 g/dL (ref 30.0–36.0)
MCV: 81.1 fL (ref 80.0–100.0)
Monocytes Absolute: 0.6 10*3/uL (ref 0.1–1.0)
Monocytes Relative: 8 %
Neutro Abs: 4 10*3/uL (ref 1.7–7.7)
Neutrophils Relative %: 53 %
Platelets: 380 10*3/uL (ref 150–400)
RBC: 4.28 MIL/uL (ref 3.87–5.11)
RDW: 14.7 % (ref 11.5–15.5)
WBC: 7.6 10*3/uL (ref 4.0–10.5)
nRBC: 0 % (ref 0.0–0.2)

## 2023-05-22 LAB — IRON AND TIBC
Iron: 34 ug/dL (ref 28–170)
Saturation Ratios: 8 % — ABNORMAL LOW (ref 10.4–31.8)
TIBC: 406 ug/dL (ref 250–450)
UIBC: 372 ug/dL

## 2023-05-22 LAB — FERRITIN: Ferritin: 13 ng/mL (ref 11–307)

## 2023-05-22 MED FILL — Iron Sucrose Inj 20 MG/ML (Fe Equiv): INTRAVENOUS | Qty: 10 | Status: AC

## 2023-05-23 ENCOUNTER — Inpatient Hospital Stay: Payer: BC Managed Care – PPO | Admitting: Nurse Practitioner

## 2023-05-23 ENCOUNTER — Ambulatory Visit: Payer: BC Managed Care – PPO | Admitting: Oncology

## 2023-05-23 ENCOUNTER — Other Ambulatory Visit: Payer: Self-pay

## 2023-05-23 ENCOUNTER — Inpatient Hospital Stay: Payer: BC Managed Care – PPO

## 2023-05-29 MED FILL — Iron Sucrose Inj 20 MG/ML (Fe Equiv): INTRAVENOUS | Qty: 10 | Status: AC

## 2023-05-30 ENCOUNTER — Inpatient Hospital Stay: Payer: BC Managed Care – PPO

## 2023-05-30 ENCOUNTER — Encounter: Payer: Self-pay | Admitting: Nurse Practitioner

## 2023-05-30 ENCOUNTER — Inpatient Hospital Stay (HOSPITAL_BASED_OUTPATIENT_CLINIC_OR_DEPARTMENT_OTHER): Payer: BC Managed Care – PPO | Admitting: Nurse Practitioner

## 2023-05-30 VITALS — BP 126/87 | HR 78 | Temp 97.8°F | Wt 236.0 lb

## 2023-05-30 DIAGNOSIS — D509 Iron deficiency anemia, unspecified: Secondary | ICD-10-CM | POA: Diagnosis not present

## 2023-05-30 DIAGNOSIS — Z9189 Other specified personal risk factors, not elsewhere classified: Secondary | ICD-10-CM

## 2023-05-30 NOTE — Progress Notes (Signed)
[facility]  Telephone:(336) [phone] Fax:(336) [phone]  ID: Charlett Lango OB: [birth year]  MR#: [id number]  AOZ#:[id number]  Patient Care Team: Margaretann Loveless, MD as PCP - General (Internal Medicine) Jeralyn Ruths, MD as Consulting Physician (Oncology)  CHIEF COMPLAINT: Iron deficiency anemia.   INTERVAL HISTORY: Patient is a 42 year old female who returns to clinic for further evaluation and consideration of IV iron.  She continues to have heavy vaginal bleeding due to known fibroids.  She desires fertility and therefore, has held off on hysterectomy.  She has some fatigue but is overall improved.  Has not had ice cravings. Denies any neurologic complaints. Denies recent fevers or illnesses. Denies any easy bleeding or bruising. No melena or hematochezia. No pica or restless leg. Reports good appetite and denies weight loss. Denies chest pain. Denies any nausea, vomiting, constipation, or diarrhea. Denies urinary complaints. Patient offers no further specific complaints today.   REVIEW OF SYSTEMS:   Review of Systems  Constitutional:  Positive for malaise/fatigue. Negative for fever and weight loss.  Respiratory: Negative.  Negative for cough, hemoptysis and shortness of breath.   Cardiovascular: Negative.  Negative for chest pain and leg swelling.  Gastrointestinal: Negative.  Negative for abdominal pain, blood in stool and melena.  Genitourinary: Negative.  Negative for hematuria.  Musculoskeletal: Negative.  Negative for back pain and falls.  Skin: Negative.  Negative for rash.  Neurological: Negative.  Negative for dizziness, focal weakness and weakness.  Psychiatric/Behavioral: Negative.  The patient is not nervous/anxious.   As per HPI. Otherwise, a complete review of systems is negative.   PAST MEDICAL HISTORY: Past Medical History:  Diagnosis Date   Anemia    Fibroid uterus    Iron deficiency anemia    Right ovarian cyst     PAST SURGICAL  HISTORY: Past Surgical History:  Procedure Laterality Date   FINGER SURGERY Right 1994   middle finger crushed by slamming door   oophrectomy Right    TONSILLECTOMY      FAMILY HISTORY: Family History  Problem Relation Age of Onset   Diabetes Mother    Glaucoma Mother    Hypertension Mother    Diabetes Father    Hypertension Father    Seizures Sister    Breast cancer Neg Hx     ADVANCED DIRECTIVES (Y/N):  N  HEALTH MAINTENANCE: Social History   Tobacco Use   Smoking status: Former    Types: Cigarettes   Smokeless tobacco: Never  Vaping Use   Vaping status: Every Day   Substances: Nicotine  Substance Use Topics   Alcohol use: Not Currently    Comment: socially   Drug use: Never     Colonoscopy:  PAP:  Bone density:  Lipid panel:  Allergies  Allergen Reactions   Amoxicillin     Swelling of lips Dry skin     Current Outpatient Medications  Medication Sig Dispense Refill   escitalopram (LEXAPRO) 10 MG tablet Take 1 tablet (10 mg total) by mouth daily. (Patient not taking: Reported on 05/30/2023) 30 tablet 6   ibuprofen (ADVIL) 600 MG tablet Take 1 tablet (600 mg total) by mouth every 6 (six) hours as needed. (Patient not taking: Reported on 05/30/2023) 60 tablet 0   No current facility-administered medications for this visit.    OBJECTIVE: Vitals:   05/30/23 1426  BP: 126/87  Pulse: 78  Temp: 97.8 F (36.6 C)     Body mass index is 33.86 kg/m.  ECOG FS:1 - Symptomatic but completely ambulatory  General: Well-developed, well-nourished, no acute distress. Eyes: Pink conjunctiva, anicteric sclera. Lungs: Clear to auscultation bilaterally.  No audible wheezing or coughing Heart: Regular rate and rhythm.  Abdomen: Soft, nontender, nondistended.  Musculoskeletal: No edema, cyanosis, or clubbing. Neuro: Alert, answering all questions appropriately. Cranial nerves grossly intact. Skin: No rashes or petechiae noted. Psych: Normal affect.   LAB  RESULTS: Lab Results  Component Value Date   WBC 7.6 05/22/2023   NEUTROABS 4.0 05/22/2023   HGB 10.8 (L) 05/22/2023   HCT 34.7 (L) 05/22/2023   MCV 81.1 05/22/2023   PLT 380 05/22/2023   Lab Results  Component Value Date   IRON 34 05/22/2023   TIBC 406 05/22/2023   IRONPCTSAT 8 (L) 05/22/2023   Lab Results  Component Value Date   FERRITIN 13 05/22/2023    STUDIES: No results found.  ASSESSMENT:  Iron deficiency anemia.   PLAN:    Iron deficiency anemia: secondary to chronic blood loss. Hemoglobin slightly improved but persistent anemia. Hmg 10.8. ferritin 13, iron sat 8%. Improved but persistent symptoms. Plan for IV Venofer 200 mg today and then return to clinic 4 times over next 4 weeks for treatment. Return to clinic in 3-4 months for reevaluation.  Thrombocytosis: Secondary to iron deficiency. Resolved.  Right adnexal cystic mass: Removed on September 28, 2022.  No malignancy was identified. Uterine fibroids & heavy menses- desires fertility. Will refer to [facility] for discussion of possible treatments and impacts on fertility as well as possible oocyte preservation. She continues to be followed by Dr Logan Bores.   Disposition:  Venofer today Venofer x 4 (weekly) 3 - 4 mo- lab (cbc, ferritin, iron studies) Day to week later - see Dr Orlie Dakin, +/- venofer- la  Patient expressed understanding and was in agreement with this plan. She also understands that She can call clinic at any time with any questions, concerns, or complaints.    Alinda Dooms, NP   05/30/2023

## 2023-06-06 ENCOUNTER — Inpatient Hospital Stay: Payer: BC Managed Care – PPO

## 2023-06-06 VITALS — BP 125/73 | HR 65 | Resp 17

## 2023-06-06 DIAGNOSIS — D509 Iron deficiency anemia, unspecified: Secondary | ICD-10-CM

## 2023-06-06 MED ORDER — SODIUM CHLORIDE 0.9 % IV SOLN
Freq: Once | INTRAVENOUS | Status: AC
  Filled 2023-06-06: qty 250

## 2023-06-06 MED ORDER — SODIUM CHLORIDE 0.9 % IV SOLN
200.0000 mg | Freq: Once | INTRAVENOUS | Status: DC
  Filled 2023-06-06: qty 10

## 2023-06-06 MED ORDER — SODIUM CHLORIDE 0.9% FLUSH
10.0000 mL | Freq: Once | INTRAVENOUS | Status: AC | PRN
  Administered 2023-06-06: 10 mL
  Filled 2023-06-06: qty 10

## 2023-06-06 MED ORDER — SODIUM CHLORIDE 0.9 % IV SOLN
200.0000 mg | Freq: Once | INTRAVENOUS | Status: AC
  Administered 2023-06-06: 200 mg via INTRAVENOUS
  Filled 2023-06-06: qty 200

## 2023-06-06 NOTE — Progress Notes (Addendum)
Patient tolerated Venofer infusion well, no questions/concerns voiced. Patient stable at discharge. Monitored 30 min post transfusion. AVS given.

## 2023-06-06 NOTE — Patient Instructions (Signed)
 Iron Sucrose Injection What is this medication? IRON SUCROSE (EYE ern SOO krose) treats low levels of iron (iron deficiency anemia) in people with kidney disease. Iron is a mineral that plays an important role in making red blood cells, which carry oxygen from your lungs to the rest of your body. This medicine may be used for other purposes; ask your health care provider or pharmacist if you have questions. COMMON BRAND NAME(S): Venofer What should I tell my care team before I take this medication? They need to know if you have any of these conditions: Anemia not caused by low iron levels Heart disease High levels of iron in the blood Kidney disease Liver disease An unusual or allergic reaction to iron, other medications, foods, dyes, or preservatives Pregnant or trying to get pregnant Breastfeeding How should I use this medication? This medication is for infusion into a vein. It is given in a hospital or clinic setting. Talk to your care team about the use of this medication in children. While this medication may be prescribed for children as young as 2 years for selected conditions, precautions do apply. Overdosage: If you think you have taken too much of this medicine contact a poison control center or emergency room at once. NOTE: This medicine is only for you. Do not share this medicine with others. What if I miss a dose? Keep appointments for follow-up doses. It is important not to miss your dose. Call your care team if you are unable to keep an appointment. What may interact with this medication? Do not take this medication with any of the following: Deferoxamine Dimercaprol Other iron products This medication may also interact with the following: Chloramphenicol Deferasirox This list may not describe all possible interactions. Give your health care provider a list of all the medicines, herbs, non-prescription drugs, or dietary supplements you use. Also tell them if you smoke,  drink alcohol, or use illegal drugs. Some items may interact with your medicine. What should I watch for while using this medication? Visit your care team regularly. Tell your care team if your symptoms do not start to get better or if they get worse. You may need blood work done while you are taking this medication. You may need to follow a special diet. Talk to your care team. Foods that contain iron include: whole grains/cereals, dried fruits, beans, or peas, leafy green vegetables, and organ meats (liver, kidney). What side effects may I notice from receiving this medication? Side effects that you should report to your care team as soon as possible: Allergic reactions--skin rash, itching, hives, swelling of the face, lips, tongue, or throat Low blood pressure--dizziness, feeling faint or lightheaded, blurry vision Shortness of breath Side effects that usually do not require medical attention (report to your care team if they continue or are bothersome): Flushing Headache Joint pain Muscle pain Nausea Pain, redness, or irritation at injection site This list may not describe all possible side effects. Call your doctor for medical advice about side effects. You may report side effects to FDA at 1-800-FDA-1088. Where should I keep my medication? This medication is given in a hospital or clinic. It will not be stored at home. NOTE: This sheet is a summary. It may not cover all possible information. If you have questions about this medicine, talk to your doctor, pharmacist, or health care provider.  2024 [employer] Standard (2023-03-08 00:00:00)

## 2023-06-13 ENCOUNTER — Inpatient Hospital Stay: Payer: BC Managed Care – PPO

## 2023-06-13 VITALS — BP 123/72 | HR 60

## 2023-06-13 DIAGNOSIS — D509 Iron deficiency anemia, unspecified: Secondary | ICD-10-CM | POA: Diagnosis not present

## 2023-06-13 MED ORDER — SODIUM CHLORIDE 0.9 % IV SOLN
200.0000 mg | Freq: Once | INTRAVENOUS | Status: AC
  Administered 2023-06-13: 200 mg via INTRAVENOUS
  Filled 2023-06-13: qty 200

## 2023-06-13 MED ORDER — SODIUM CHLORIDE 0.9 % IV SOLN
Freq: Once | INTRAVENOUS | Status: AC
  Filled 2023-06-13: qty 250

## 2023-06-20 ENCOUNTER — Inpatient Hospital Stay: Payer: BC Managed Care – PPO | Attending: Oncology

## 2023-06-20 VITALS — BP 124/75 | HR 70 | Temp 98.4°F

## 2023-06-20 DIAGNOSIS — D259 Leiomyoma of uterus, unspecified: Secondary | ICD-10-CM | POA: Insufficient documentation

## 2023-06-20 DIAGNOSIS — N920 Excessive and frequent menstruation with regular cycle: Secondary | ICD-10-CM | POA: Insufficient documentation

## 2023-06-20 DIAGNOSIS — D509 Iron deficiency anemia, unspecified: Secondary | ICD-10-CM

## 2023-06-20 DIAGNOSIS — D500 Iron deficiency anemia secondary to blood loss (chronic): Secondary | ICD-10-CM | POA: Insufficient documentation

## 2023-06-20 MED ORDER — SODIUM CHLORIDE 0.9 % IV SOLN
Freq: Once | INTRAVENOUS | Status: AC
  Filled 2023-06-20: qty 250

## 2023-06-20 MED ORDER — SODIUM CHLORIDE 0.9 % IV SOLN
200.0000 mg | Freq: Once | INTRAVENOUS | Status: AC
  Administered 2023-06-20: 200 mg via INTRAVENOUS
  Filled 2023-06-20: qty 200

## 2023-06-20 NOTE — Patient Instructions (Signed)
 Iron Sucrose Injection What is this medication? IRON SUCROSE (EYE ern SOO krose) treats low levels of iron (iron deficiency anemia) in people with kidney disease. Iron is a mineral that plays an important role in making red blood cells, which carry oxygen from your lungs to the rest of your body. This medicine may be used for other purposes; ask your health care provider or pharmacist if you have questions. COMMON BRAND NAME(S): Venofer What should I tell my care team before I take this medication? They need to know if you have any of these conditions: Anemia not caused by low iron levels Heart disease High levels of iron in the blood Kidney disease Liver disease An unusual or allergic reaction to iron, other medications, foods, dyes, or preservatives Pregnant or trying to get pregnant Breastfeeding How should I use this medication? This medication is for infusion into a vein. It is given in a hospital or clinic setting. Talk to your care team about the use of this medication in children. While this medication may be prescribed for children as young as 2 years for selected conditions, precautions do apply. Overdosage: If you think you have taken too much of this medicine contact a poison control center or emergency room at once. NOTE: This medicine is only for you. Do not share this medicine with others. What if I miss a dose? Keep appointments for follow-up doses. It is important not to miss your dose. Call your care team if you are unable to keep an appointment. What may interact with this medication? Do not take this medication with any of the following: Deferoxamine Dimercaprol Other iron products This medication may also interact with the following: Chloramphenicol Deferasirox This list may not describe all possible interactions. Give your health care provider a list of all the medicines, herbs, non-prescription drugs, or dietary supplements you use. Also tell them if you smoke,  drink alcohol, or use illegal drugs. Some items may interact with your medicine. What should I watch for while using this medication? Visit your care team regularly. Tell your care team if your symptoms do not start to get better or if they get worse. You may need blood work done while you are taking this medication. You may need to follow a special diet. Talk to your care team. Foods that contain iron include: whole grains/cereals, dried fruits, beans, or peas, leafy green vegetables, and organ meats (liver, kidney). What side effects may I notice from receiving this medication? Side effects that you should report to your care team as soon as possible: Allergic reactions--skin rash, itching, hives, swelling of the face, lips, tongue, or throat Low blood pressure--dizziness, feeling faint or lightheaded, blurry vision Shortness of breath Side effects that usually do not require medical attention (report to your care team if they continue or are bothersome): Flushing Headache Joint pain Muscle pain Nausea Pain, redness, or irritation at injection site This list may not describe all possible side effects. Call your doctor for medical advice about side effects. You may report side effects to FDA at 1-800-FDA-1088. Where should I keep my medication? This medication is given in a hospital or clinic. It will not be stored at home. NOTE: This sheet is a summary. It may not cover all possible information. If you have questions about this medicine, talk to your doctor, pharmacist, or health care provider.  2024 [employer] Standard (2023-03-08 00:00:00)

## 2023-06-27 ENCOUNTER — Inpatient Hospital Stay: Payer: BC Managed Care – PPO

## 2023-06-27 VITALS — BP 124/74 | HR 56 | Temp 98.4°F | Resp 18

## 2023-06-27 DIAGNOSIS — D509 Iron deficiency anemia, unspecified: Secondary | ICD-10-CM

## 2023-06-27 DIAGNOSIS — D500 Iron deficiency anemia secondary to blood loss (chronic): Secondary | ICD-10-CM | POA: Diagnosis not present

## 2023-06-27 MED ORDER — SODIUM CHLORIDE 0.9 % IV SOLN
Freq: Once | INTRAVENOUS | Status: AC
  Filled 2023-06-27: qty 250

## 2023-06-27 MED ORDER — SODIUM CHLORIDE 0.9 % IV SOLN
200.0000 mg | Freq: Once | INTRAVENOUS | Status: AC
  Administered 2023-06-27: 200 mg via INTRAVENOUS
  Filled 2023-06-27: qty 200

## 2023-06-27 NOTE — Progress Notes (Signed)
Pt declined the 30 minute observation period post venofer.  Pt tolerated administration well and left the infusion suite stable and ambulatory

## 2023-06-27 NOTE — Patient Instructions (Signed)
Iron Sucrose Injection What is this medication? IRON SUCROSE (EYE ern SOO krose) treats low levels of iron (iron deficiency anemia) in people with kidney disease. Iron is a mineral that plays an important role in making red blood cells, which carry oxygen from your lungs to the rest of your body. This medicine may be used for other purposes; ask your health care provider or pharmacist if you have questions. COMMON BRAND NAME(S): Venofer What should I tell my care team before I take this medication? They need to know if you have any of these conditions: Anemia not caused by low iron levels Heart disease High levels of iron in the blood Kidney disease Liver disease An unusual or allergic reaction to iron, other medications, foods, dyes, or preservatives Pregnant or trying to get pregnant Breastfeeding How should I use this medication? This medication is for infusion into a vein. It is given in a hospital or clinic setting. Talk to your care team about the use of this medication in children. While this medication may be prescribed for children as young as 2 years for selected conditions, precautions do apply. Overdosage: If you think you have taken too much of this medicine contact a poison control center or emergency room at once. NOTE: This medicine is only for you. Do not share this medicine with others. What if I miss a dose? Keep appointments for follow-up doses. It is important not to miss your dose. Call your care team if you are unable to keep an appointment. What may interact with this medication? Do not take this medication with any of the following: Deferoxamine Dimercaprol Other iron products This medication may also interact with the following: Chloramphenicol Deferasirox This list may not describe all possible interactions. Give your health care provider a list of all the medicines, herbs, non-prescription drugs, or dietary supplements you use. Also tell them if you smoke,  drink alcohol, or use illegal drugs. Some items may interact with your medicine. What should I watch for while using this medication? Visit your care team regularly. Tell your care team if your symptoms do not start to get better or if they get worse. You may need blood work done while you are taking this medication. You may need to follow a special diet. Talk to your care team. Foods that contain iron include: whole grains/cereals, dried fruits, beans, or peas, leafy green vegetables, and organ meats (liver, kidney). What side effects may I notice from receiving this medication? Side effects that you should report to your care team as soon as possible: Allergic reactions--skin rash, itching, hives, swelling of the face, lips, tongue, or throat Low blood pressure--dizziness, feeling faint or lightheaded, blurry vision Shortness of breath Side effects that usually do not require medical attention (report to your care team if they continue or are bothersome): Flushing Headache Joint pain Muscle pain Nausea Pain, redness, or irritation at injection site This list may not describe all possible side effects. Call your doctor for medical advice about side effects. You may report side effects to FDA at 1-800-FDA-1088. Where should I keep my medication? This medication is given in a hospital or clinic. It will not be stored at home. NOTE: This sheet is a summary. It may not cover all possible information. If you have questions about this medicine, talk to your doctor, pharmacist, or health care provider.  2024 [employer] Standard (2023-03-08 00:00:00)

## 2023-10-02 ENCOUNTER — Inpatient Hospital Stay: Payer: BC Managed Care – PPO | Attending: Internal Medicine

## 2023-10-03 ENCOUNTER — Inpatient Hospital Stay: Payer: BC Managed Care – PPO | Admitting: Oncology

## 2023-10-03 ENCOUNTER — Inpatient Hospital Stay: Payer: BC Managed Care – PPO

## 2023-11-18 ENCOUNTER — Inpatient Hospital Stay: Payer: BC Managed Care – PPO

## 2023-11-19 ENCOUNTER — Inpatient Hospital Stay: Payer: BC Managed Care – PPO

## 2023-11-19 ENCOUNTER — Inpatient Hospital Stay: Payer: BC Managed Care – PPO | Admitting: Oncology
